# Patient Record
Sex: Female | Born: 1945 | Hispanic: No | Marital: Married | Smoking: Never smoker
Health system: Southern US, Community
[De-identification: ages and names within clinical notes are randomized; demographics above are authoritative.]

## PROBLEM LIST (undated history)

## (undated) DIAGNOSIS — F419 Anxiety disorder, unspecified: Secondary | ICD-10-CM

## (undated) DIAGNOSIS — D249 Benign neoplasm of unspecified breast: Secondary | ICD-10-CM

## (undated) DIAGNOSIS — E785 Hyperlipidemia, unspecified: Secondary | ICD-10-CM

## (undated) DIAGNOSIS — G4730 Sleep apnea, unspecified: Secondary | ICD-10-CM

## (undated) DIAGNOSIS — M1990 Unspecified osteoarthritis, unspecified site: Secondary | ICD-10-CM

## (undated) DIAGNOSIS — N939 Abnormal uterine and vaginal bleeding, unspecified: Secondary | ICD-10-CM

## (undated) DIAGNOSIS — K219 Gastro-esophageal reflux disease without esophagitis: Secondary | ICD-10-CM

## (undated) DIAGNOSIS — J309 Allergic rhinitis, unspecified: Secondary | ICD-10-CM

## (undated) DIAGNOSIS — I471 Supraventricular tachycardia, unspecified: Secondary | ICD-10-CM

## (undated) DIAGNOSIS — I10 Essential (primary) hypertension: Secondary | ICD-10-CM

## (undated) DIAGNOSIS — K648 Other hemorrhoids: Secondary | ICD-10-CM

## (undated) DIAGNOSIS — I499 Cardiac arrhythmia, unspecified: Secondary | ICD-10-CM

## (undated) DIAGNOSIS — E119 Type 2 diabetes mellitus without complications: Secondary | ICD-10-CM

## (undated) DIAGNOSIS — R42 Dizziness and giddiness: Secondary | ICD-10-CM

## (undated) HISTORY — PX: BREAST SURGERY: SHX581

---

## 1973-04-02 HISTORY — PX: DILATION AND CURETTAGE OF UTERUS: SHX78

## 1974-04-02 HISTORY — PX: BREAST EXCISIONAL BIOPSY: SUR124

## 2002-04-02 HISTORY — PX: UVULOPALATOPLASTY: SHX2633

## 2004-04-02 HISTORY — PX: COLONOSCOPY: SHX174

## 2004-04-07 ENCOUNTER — Inpatient Hospital Stay: Payer: Self-pay | Admitting: Internal Medicine

## 2004-04-18 ENCOUNTER — Ambulatory Visit: Payer: Self-pay | Admitting: Unknown Physician Specialty

## 2004-07-04 ENCOUNTER — Ambulatory Visit: Payer: Self-pay | Admitting: Gastroenterology

## 2005-05-02 ENCOUNTER — Ambulatory Visit: Payer: Self-pay | Admitting: Unknown Physician Specialty

## 2006-05-07 ENCOUNTER — Ambulatory Visit: Payer: Self-pay | Admitting: Unknown Physician Specialty

## 2007-06-05 ENCOUNTER — Ambulatory Visit: Payer: Self-pay | Admitting: Unknown Physician Specialty

## 2008-04-02 HISTORY — PX: WRIST GANGLION EXCISION: SUR520

## 2008-04-07 ENCOUNTER — Ambulatory Visit: Payer: Self-pay | Admitting: Specialist

## 2008-04-07 ENCOUNTER — Ambulatory Visit: Payer: Self-pay | Admitting: Cardiology

## 2008-04-15 ENCOUNTER — Ambulatory Visit: Payer: Self-pay | Admitting: Specialist

## 2008-06-15 ENCOUNTER — Ambulatory Visit: Payer: Self-pay | Admitting: Unknown Physician Specialty

## 2009-05-07 ENCOUNTER — Emergency Department: Payer: Self-pay | Admitting: Emergency Medicine

## 2009-06-21 ENCOUNTER — Ambulatory Visit: Payer: Self-pay | Admitting: Unknown Physician Specialty

## 2010-06-28 ENCOUNTER — Ambulatory Visit: Payer: Self-pay | Admitting: Unknown Physician Specialty

## 2011-07-03 ENCOUNTER — Ambulatory Visit: Payer: Self-pay | Admitting: Family Medicine

## 2012-04-02 HISTORY — PX: ROTATOR CUFF REPAIR: SHX139

## 2012-08-07 ENCOUNTER — Ambulatory Visit: Payer: Self-pay | Admitting: Family Medicine

## 2012-10-21 ENCOUNTER — Ambulatory Visit: Payer: Self-pay | Admitting: Orthopedic Surgery

## 2012-10-30 ENCOUNTER — Ambulatory Visit: Payer: Self-pay | Admitting: Orthopedic Surgery

## 2012-11-04 ENCOUNTER — Ambulatory Visit: Payer: Self-pay | Admitting: Orthopedic Surgery

## 2013-08-06 ENCOUNTER — Ambulatory Visit: Payer: Self-pay | Admitting: Physician Assistant

## 2013-08-11 ENCOUNTER — Ambulatory Visit: Payer: Self-pay | Admitting: Physician Assistant

## 2013-08-12 LAB — PATHOLOGY REPORT

## 2013-09-01 ENCOUNTER — Ambulatory Visit: Payer: Self-pay | Admitting: Surgery

## 2013-09-01 LAB — POTASSIUM: POTASSIUM: 3.4 mmol/L — AB (ref 3.5–5.1)

## 2013-09-11 ENCOUNTER — Ambulatory Visit: Payer: Self-pay | Admitting: Surgery

## 2013-09-14 HISTORY — PX: BREAST EXCISIONAL BIOPSY: SUR124

## 2013-09-14 LAB — PATHOLOGY REPORT

## 2014-03-15 ENCOUNTER — Ambulatory Visit: Payer: Self-pay | Admitting: Family Medicine

## 2014-04-02 ENCOUNTER — Ambulatory Visit: Payer: Self-pay | Admitting: Family Medicine

## 2014-05-03 ENCOUNTER — Ambulatory Visit: Payer: Self-pay | Admitting: Family Medicine

## 2014-07-23 NOTE — Op Note (Signed)
PATIENT NAME:  Nicole Jordan, Nicole Jordan  MR#:  [id number] DATE OF BIRTH:  06/14/1945  DATE OF PROCEDURE:  11/04/2012  PREOPERATIVE DIAGNOSES:  1. Right shoulder rotator cuff tear.  2. Subacromial impingement.  3. Biceps tenosynovitis/fraying.   POSTOPERATIVE DIAGNOSES:  1. Right shoulder rotator cuff tear.  2. Subacromial impingement.  3. Biceps tenosynovitis/fraying.   PROCEDURE PERFORMED:  1. Arthroscopic rotator cuff repair.  2. Subacromial decompression and extensive bursectomy.  3. Biceps tenotomy.   SURGEON: Dawayne Patricia, MD  ASSISTANT: Anitra Lauth, PA  ANESTHESIA: General anesthesia and interscalene block.   ESTIMATED BLOOD LOSS: Minimal.   SURGICAL FINDINGS: Nicole Jordan was found to have extensive synovitis about the shoulder. Her biceps was found to be quite frayed and injected. She was found to have a complex tear of the supraspinatus with a very narrow U shape at the anterior aspect of the tendon. Extensive scarring and thickening of the rotator interval and downward sloping lateral acromion.   COMPLICATIONS: No immediate intraoperative or postoperative complications are noted.   INDICATIONS FOR PROCEDURE: Nicole Jordan is a 69 year old female who presented as an outpatient after sustaining a fall. She noted significant weakness and pain with motion of the shoulder. MRI confirmed the presence of a rotator cuff tear. After medical clearance was obtained, the patient decided to proceed with surgical intervention. Risks and benefits were explained to the patient. All of her questions were answered. She demonstrated good understanding of the surgical procedure as well as the expected course of recovery. Her daughter was present at the preoperative visit.   DESCRIPTION OF PROCEDURE: Nicole Jordan was identified in the preoperative holding area. Right upper extremity was marked as the operative site. She was brought into the operating room and placed on the table in a supine position.  Interscalene block and general anesthesia were administered. The patient was placed in a beach chair position with her head adequately secured and her neck properly aligned. All bony prominences were padded. The patient's body was secured using kidney pads and 2 straps.    The right upper extremity was prepared and draped in the usual sterile fashion. Surgical timeout was performed, identifying the patient, procedure, laterality, confirming administration of preoperative antibiotics, confirming that imaging present was in fact the patient's, and correlating with the consent form.   Standard posterior viewing portal was made. Arthroscope was inserted, and diagnostic arthroscopy was performed. There was extensive synovitis throughout the glenohumeral joint. There was significant labral fraying. Under direct visualization, an anterior portal was made in the rotator interval. A probe was inserted. The labrum was found to be stable. Biceps tendon was evaluated. It was found to be significantly frayed and injected. The tendon was pulled into the joint, and portion present in the groove was found to be severely injected. Rotator cuff was evaluated, and supraspinatus tendon was found to have a very narrow U-shaped tear at the anterior edge.   A shaver was inserted through the anterior portal. Extensive synovectomy was carried out. The frayed portion of the labrum was shaved back to stable edge. The undersurface of the rotator cuff tear was gently debrided. At this time, an ArthroCare wand was inserted through the anterior portal, and biceps tendon was transected. The residual stub was trimmed back to a stable edge.   A shaver was reinserted, and extensive capsulectomy was done anteriorly given the significant amount of scar tissue. The rotator interval was safely debrided back until the deep fibers of the conjoined tendon were able to  be visualized.   A lateral portal was made in line with the rotator cuff tear. A  shaver was inserted, and further debridement of the tear was carried out.   At this time, the arthroscope was inserted into the subacromial space. [place] was inserted through the lateral portal into the subacromial space. Extensive bursectomy was carried out. The rotator cuff tear was readily identified. Using a combination of a burr and ArthroCare wand, the footprint was cleared of soft tissue, and the tear was slightly widened in order to accommodate hardware placement. An awl was used to achieve areas of bleeding bone in the footprint.   Acromionizer burr was inserted through the lateral portal, and a subacromial decompression was carried out.   Determination was made to use both a side-to-side stitch as well as a SpeedBridge device. A side-to-side suture was placed given that the anterior segment of residual tendon was relatively thin and I had some concern that I would not be able to pass a FiberTape through this portion. The side-to-side suture nicely brought the tendon together.    Anterior medial anchor was placed. FiberLoop tape was passed through the anterior portion of the tendon in an area of firm-feeling tissue. FiberTape was retrieved through the anterior portal. In a similar fashion, using an awl followed by anchor deployment, a posterior medial row anchor was placed. FiberTape was passed through the posterior portion of the torn tendon, and at this time, the FiberTape loops were cut just proximal to the slicing point. One limb of each was brought back out through the lateral portal. These 2 limbs were fed through a SwiveLock for lateral row placement. An awl was used to make a hole in the anterior aspect of the lateral footprint. SwiveLock was deployed, and sutures were tightened and cleated. Anchor was deployed. Sutures were cut. In a similar fashion, the remaining 2 suture limbs were brought out the lateral portal. They were loaded through a second SwiveLock. In a similar fashion, the  posterior lateral row anchor was placed. Sutures were tightened and cleated, and anchor was deployed. The sutures were cut.   Using a SpeedBridge technique, the rotator cuff was nicely reapproximated to the footprint. The shoulder was taken through range of motion under direct visualization, and cuff was found to be intact throughout and traveled nicely as a unit. Arthroscope was then inserted into the glenohumeral joint, and rotator cuff again was evaluated and found to be nicely tacked back down to the humeral footprint.   Joint was drained of fluid. All instruments were removed. Portals were closed using 2-0 Vicryl and 3-0 nylon. Sterile dressings were applied. The 0.25% Sensorcaine plain was injected into the portal sites prior to dressing application. TENS unit was applied. Polar Care was applied. The patient was placed in a SlingShot sling. She will be discharged home the same day of surgery. She will follow up in the office later this week.   ____________________________ Dawayne Patricia, MD sr:OSi D: 11/05/2012 07:46:56 ET T: 11/05/2012 08:06:20 ET JOB#: [id number]  cc: Dawayne Patricia, MD, <Dictator> Dawayne Patricia MD ELECTRONICALLY SIGNED 11/25/2012 10:22

## 2014-07-24 NOTE — Op Note (Signed)
PATIENT NAME:  Nicole Jordan, Nicole Jordan MR#:  [id number] DATE OF BIRTH:  08-Sep-1945  DATE OF PROCEDURE:  09/11/2013  PREOPERATIVE DIAGNOSIS: Left breast mass.   POSTOPERATIVE DIAGNOSIS: Left breast mass.   PROCEDURE: Excision of left breast mass.   SURGEON: Rochel Brome, M.D.   ANESTHESIA: General.   INDICATIONS: This 69 year old female has a palpable mass in the inferior aspect of the left breast. 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   DESCRIPTION OF PROCEDURE: The patient was placed on the operating table in the supine position under general anesthesia. The left breast was prepared with ChloraPrep and draped in a sterile manner. The mass was easily palpable at the inferior border of the areola at approximately 6-o'clock to 7-o'clock position of the left breast. According to palpation, it was approximately 4 cm in dimension. A curvilinear incision was made from approximately 6-o'clock to 8-o'clock position of the areola, which was at the border of the areola and carried down through subcutaneous tissues. One traversing vein was suture ligated with 4-0 chromic and divided. The dissection was carried down through a thin layer of subcutaneous tissues and encountered a firm mass, which was not well demarcated, but was excised. During the course of the excision, there was some abrupt drainage of a black, brown, red and thick fluid suggested that part of this was cystic. The mass was excised with use of electrocautery and the excised mass appeared to be approximately 3 x 3 x 3 cm and was submitted fresh for routine pathology. The wound was inspected. Several small bleeding points were cauterized. Hemostasis subsequently appeared to be intact. The deeper tissues surrounding cautery artifact were infiltrated with 0.5% Sensorcaine with epinephrine and also subcutaneous tissues were  infiltrated. Following this, the subcutaneous tissues were closed with interrupted 4-0 chromic and the skin was closed with a running 4-0 Monocryl subcuticular suture and Dermabond. The patient tolerated surgery satisfactorily and was then prepared for transfer to the recovery room.   ____________________________ Lenna Sciara. Rochel Brome, MD jws:aw D: 09/11/2013 08:36:30 ET T: 09/11/2013 08:46:34 ET JOB#: [id number]  cc: Loreli Dollar, MD, <Dictator> Loreli Dollar MD ELECTRONICALLY SIGNED 09/14/2013 8:24

## 2014-08-24 ENCOUNTER — Other Ambulatory Visit: Payer: Self-pay | Admitting: Family Medicine

## 2014-08-24 DIAGNOSIS — Z1231 Encounter for screening mammogram for malignant neoplasm of breast: Secondary | ICD-10-CM

## 2014-09-07 ENCOUNTER — Ambulatory Visit
Admission: RE | Admit: 2014-09-07 | Discharge: 2014-09-07 | Disposition: A | Discharge status: Home / Self Care | Payer: Medicare Other | Source: Ambulatory Visit | Attending: Family Medicine | Admitting: Family Medicine

## 2014-09-07 ENCOUNTER — Other Ambulatory Visit: Payer: Self-pay | Admitting: Family Medicine

## 2014-09-07 DIAGNOSIS — Z1231 Encounter for screening mammogram for malignant neoplasm of breast: Secondary | ICD-10-CM | POA: Insufficient documentation

## 2014-11-09 ENCOUNTER — Encounter: Payer: Self-pay | Admitting: *Deleted

## 2015-08-31 ENCOUNTER — Other Ambulatory Visit: Payer: Self-pay | Admitting: Family Medicine

## 2015-08-31 DIAGNOSIS — Z1231 Encounter for screening mammogram for malignant neoplasm of breast: Secondary | ICD-10-CM

## 2015-09-15 ENCOUNTER — Ambulatory Visit
Admission: RE | Admit: 2015-09-15 | Discharge: 2015-09-15 | Disposition: A | Discharge status: Home / Self Care | Payer: Medicare Other | Source: Ambulatory Visit | Attending: Family Medicine | Admitting: Family Medicine

## 2015-09-15 ENCOUNTER — Other Ambulatory Visit: Payer: Self-pay | Admitting: Family Medicine

## 2015-09-15 DIAGNOSIS — Z1231 Encounter for screening mammogram for malignant neoplasm of breast: Secondary | ICD-10-CM | POA: Diagnosis not present

## 2016-02-10 IMAGING — US US BREAST*L* LIMITED INC AXILLA
1 series · 11 of 11 positions shown · non-contrast
Comparison: All prior studies dating back to 4009

CLINICAL DATA: History of benign biopsy left upper outer quadrant
in 9530, patient palpating a mass lower inner quadrant today

EXAM:
DIGITAL DIAGNOSTIC  BILATERAL MAMMOGRAM WITH CAD
ULTRASOUND LEFT BREAST

[Series 1: us breast*left* limited inc axilla · 0.08mm/px · 11 of 11 slices shown]
[im 1/11]
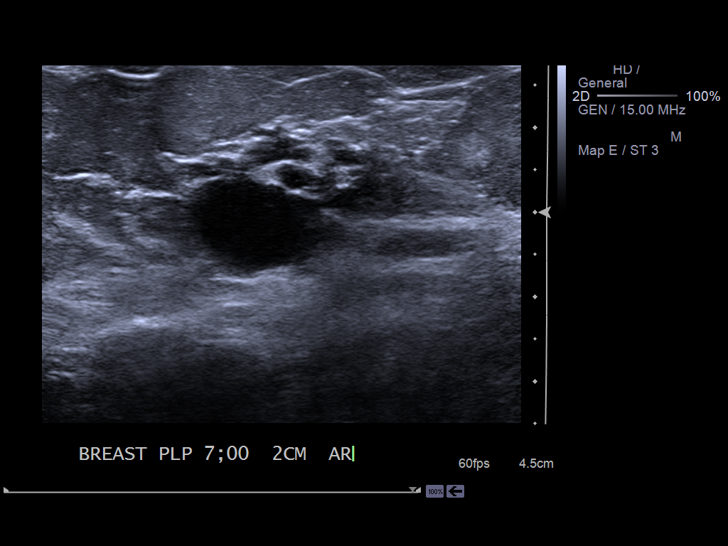
[im 2/11]
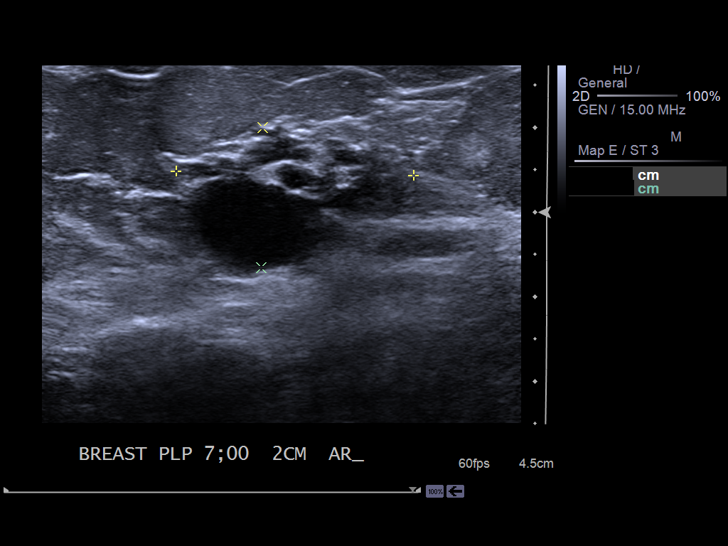
[im 3/11]
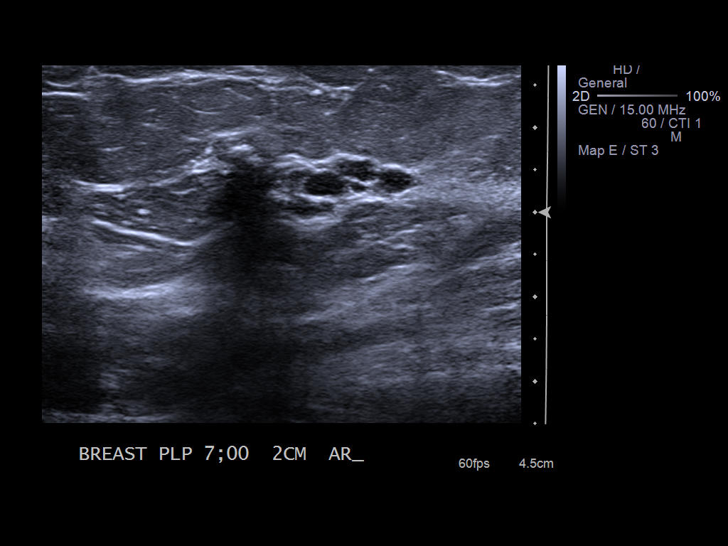
[im 4/11]
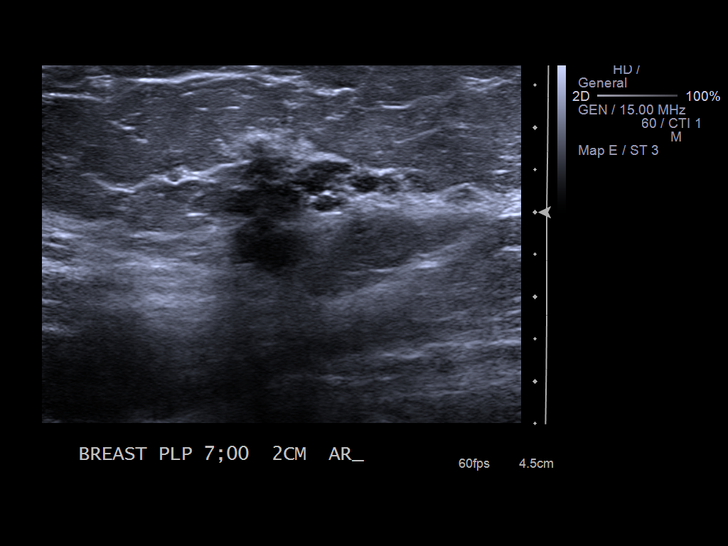
[im 5/11]
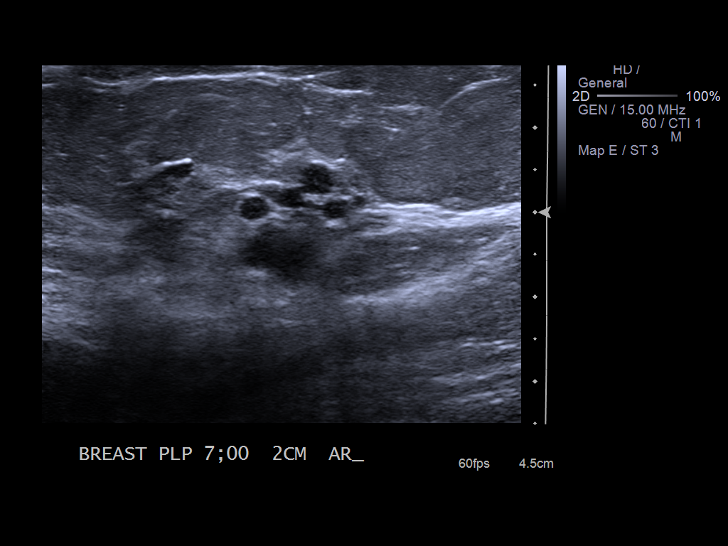
[im 6/11]
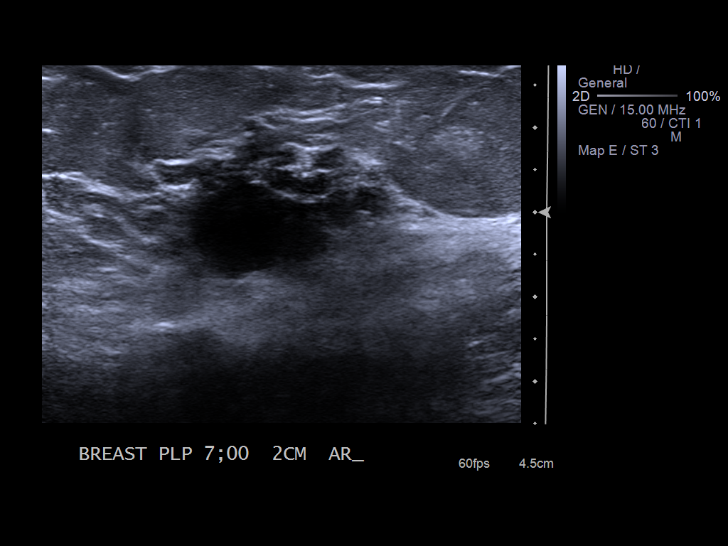
[im 7/11]
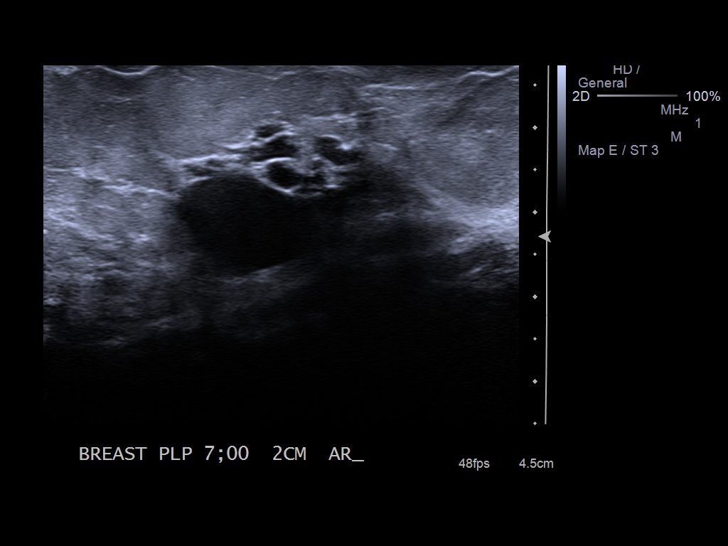
[im 8/11]
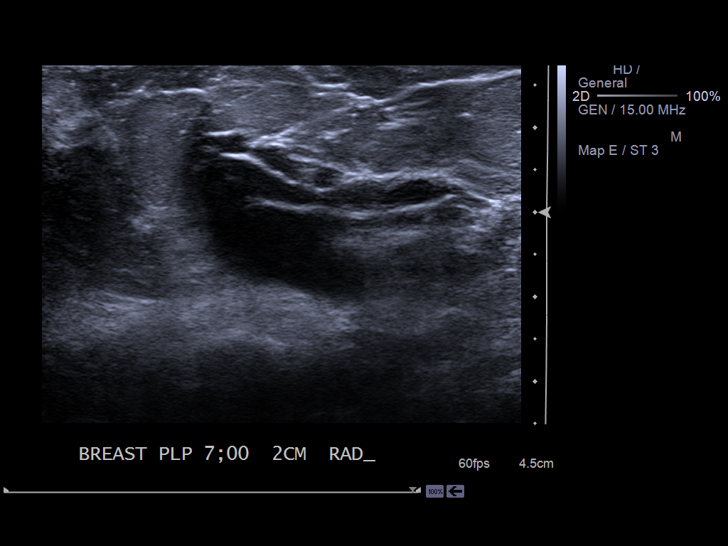
[im 9/11]
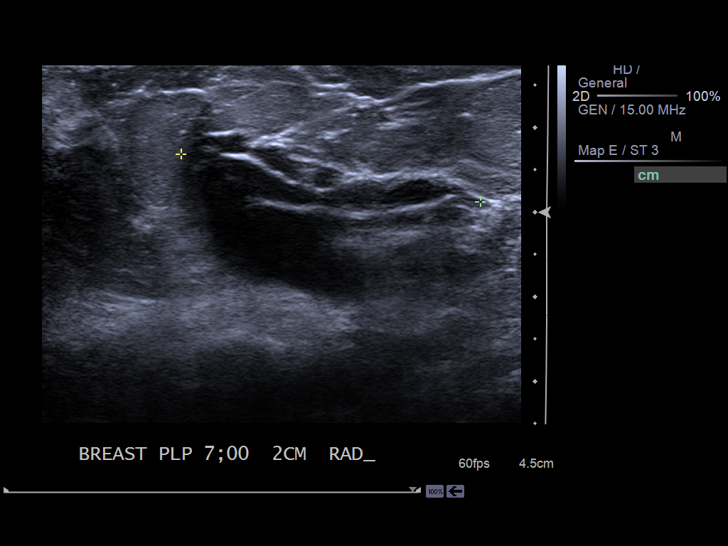
[im 10/11]
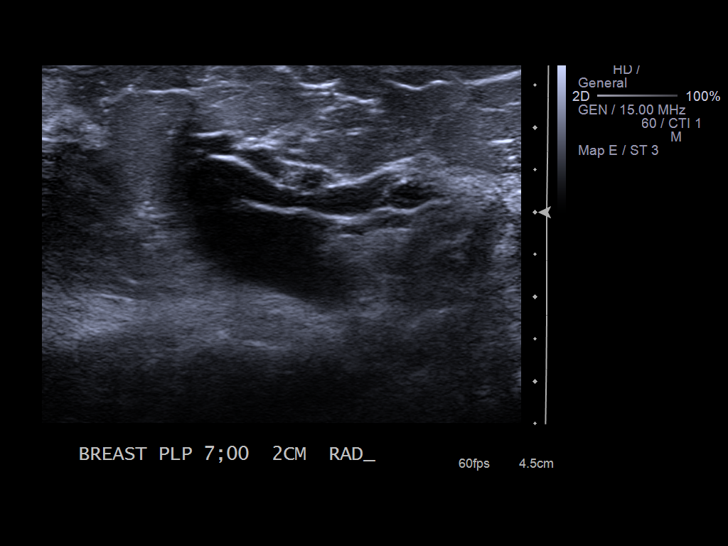
[im 11/11]
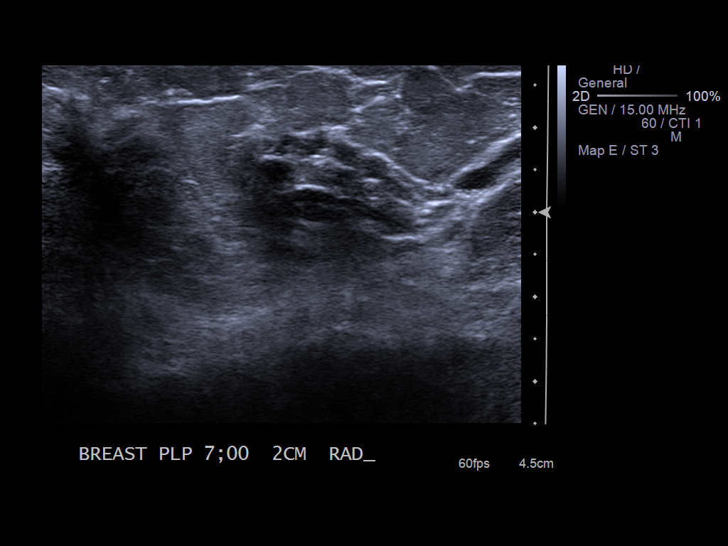

[11 of 11 positions shown; findings below may reference images not displayed]

ACR Breast Density Category c: The breast tissue is heterogeneously
dense, which may obscure small masses.
FINDINGS: Right breast is unchanged and negative. On the left in the 7 o'clock
position at the middle third depth, there is a 5 x 5 x 2 cm mass. It
is an irregular mass with indistinct borders and increased
attenuation.

Mammographic images were processed with CAD.

On physical exam, there is a probable 3 cm mass 2 cm from the nipple
in the 7 o'clock position of the left breast.

Ultrasound is performed, showing that the palpable mass measures by
ultrasound 3.6 x 2.8 x 1.7 cm. It is comprised of numerous fluid
filled tubular and cystic spaces with intervening soft tissue. The
borders of the mass are not well defined. The mass shows mixed
posterior acoustic attenuation and enhancement. Ultrasound of the
left axilla is negative.
IMPRESSION: Palpable mass left breast composed of numerous cystic and tubular
fluid-filled spaces with intervening soft tissue.

RECOMMENDATION:
Ultrasound-guided core needle biopsy is recommended and will be
scheduled at the patient's convenience.

I have discussed the findings and recommendations with the patient.
Results were also provided in writing at the conclusion of the
visit. If applicable, a reminder letter will be sent to the patient
regarding the next appointment.

BI-RADS CATEGORY  4: Suspicious.

## 2016-02-15 IMAGING — MG MM POST US BIOPSY *L*
1 series · 2 of 2 positions shown · non-contrast
Comparison: Previous exams

CLINICAL DATA: Status post ultrasound-guided core biopsy of mass in
the 7 o'clock location of the left breast.

EXAM:
DIAGNOSTIC LEFT MAMMOGRAM POST ULTRASOUND BIOPSY

[L CC · left · 2 of 2 slices shown]
[im 1/2]
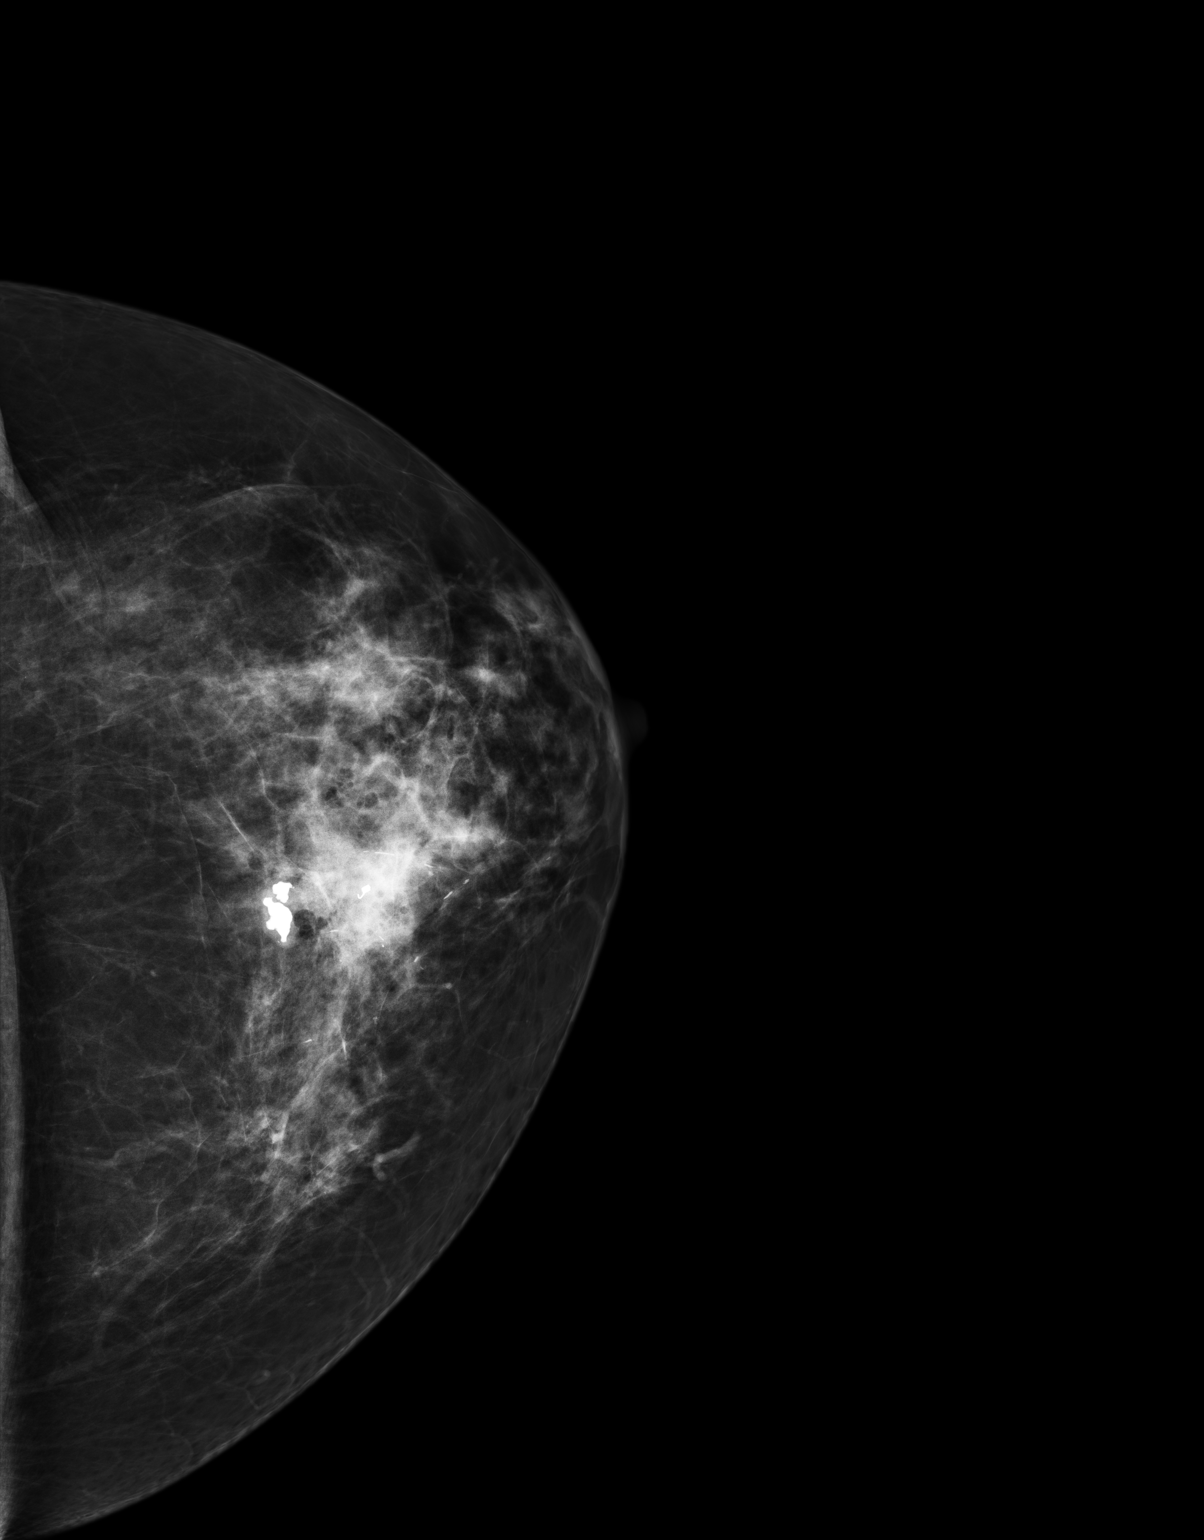
[im 2/2]
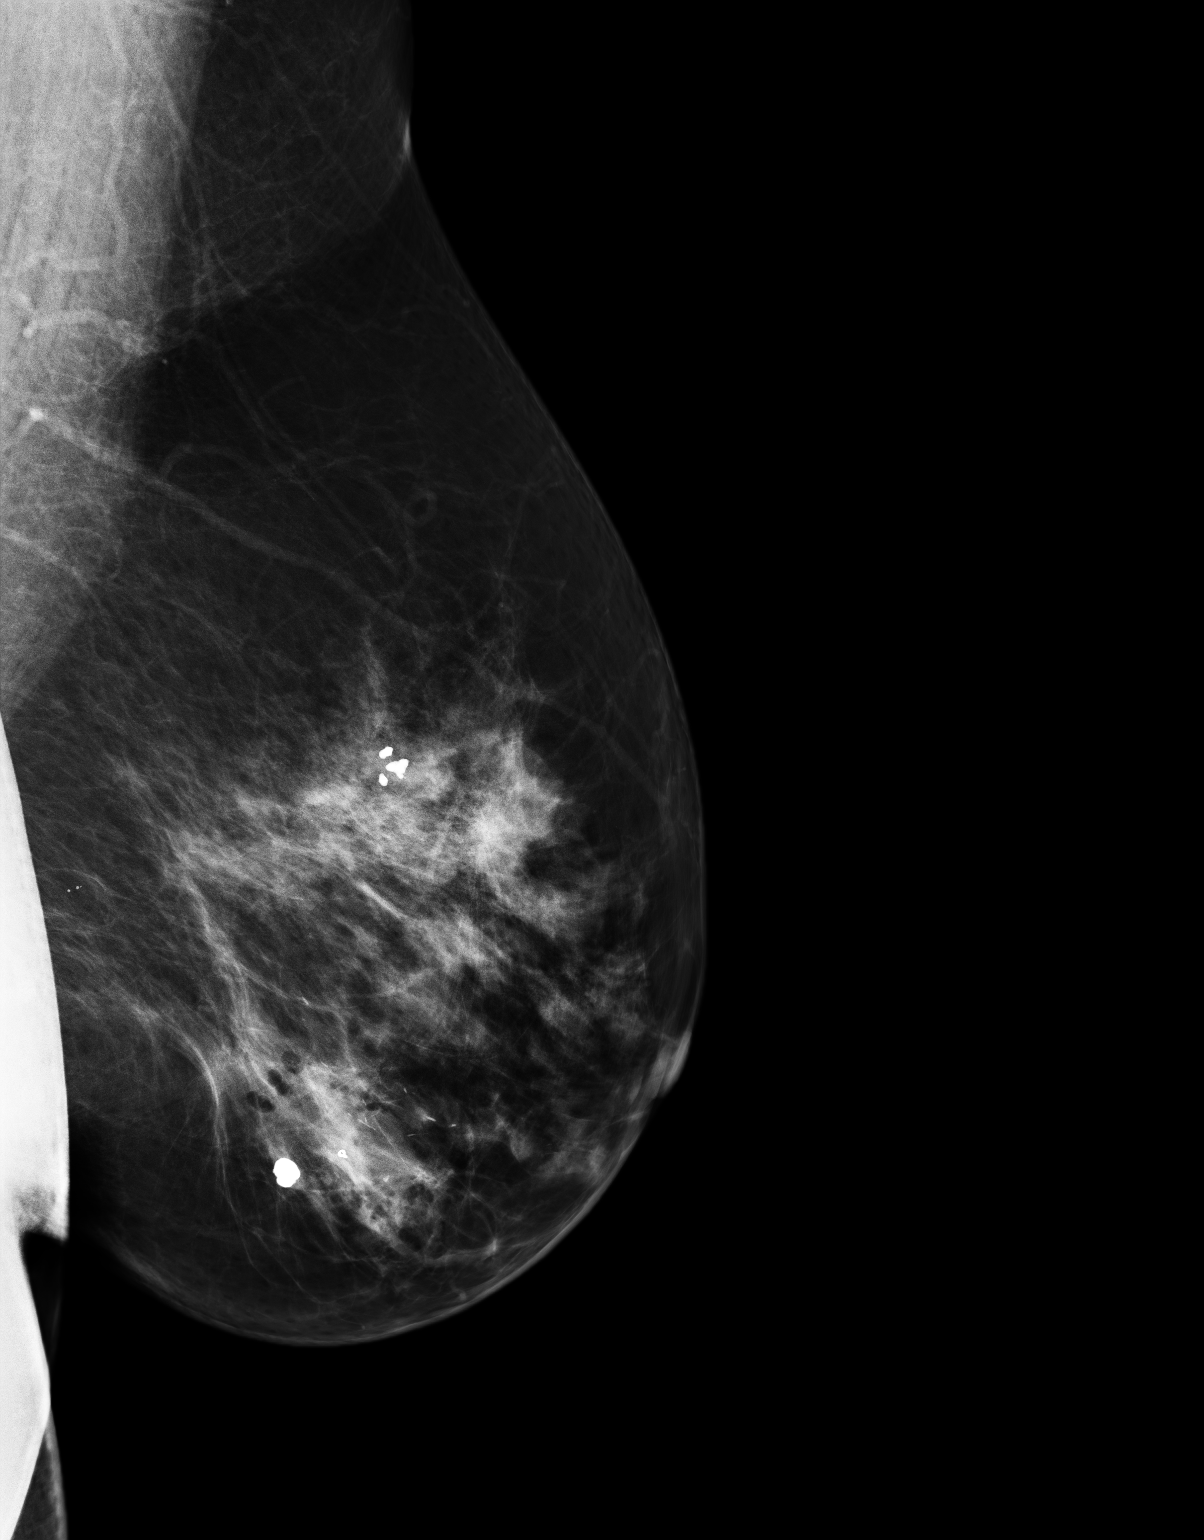

[2 of 2 positions shown; findings below may reference images not displayed]

FINDINGS: Mammographic images were obtained following ultrasound guided biopsy
of mass in the 7 o'clock location of the left breast. A coil shaped
clip is identified within the mass in the lower inner quadrant of
the left breast, as expected after biopsy..
IMPRESSION: Tissue marker clip is in expected location.

Final Assessment: Post Procedure Mammograms for Marker Placement

## 2016-02-15 IMAGING — US US BIOPSY BREAST CORE W/ IMAGING
1 series · 13 of 16 positions shown · non-contrast
Comparison: 08/07/2012, 08/06/2013, and earlier

ADDENDUM:
Pathologic result indicated cystic papillary lesion for which
excisional biopsy is recommended. This is concordant with the
imaging findings. I spoke with the patient at [DATE] on 08/14/2013,
and discussed these results and recommendations. She reported no
complications after the biopsy. Her referring physician will be
contacted and will establish surgical referral and she stated that
she would contact the office by early next week to confirm this.
CLINICAL DATA: Patient returns for ultrasound-guided core biopsy of
mass in the 7 o'clock location of the left breast.

EXAM:
ULTRASOUND GUIDED LEFT BREAST CORE NEEDLE BIOPSY

[Series 1: us biopsy breast core w/ imaging · 0.08mm/px · 13 of 16 slices shown]
[im 1/16]
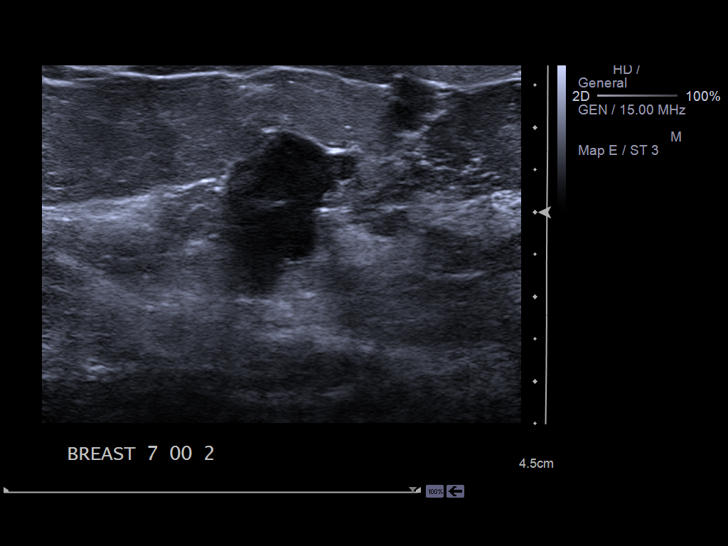
[im 2/16]
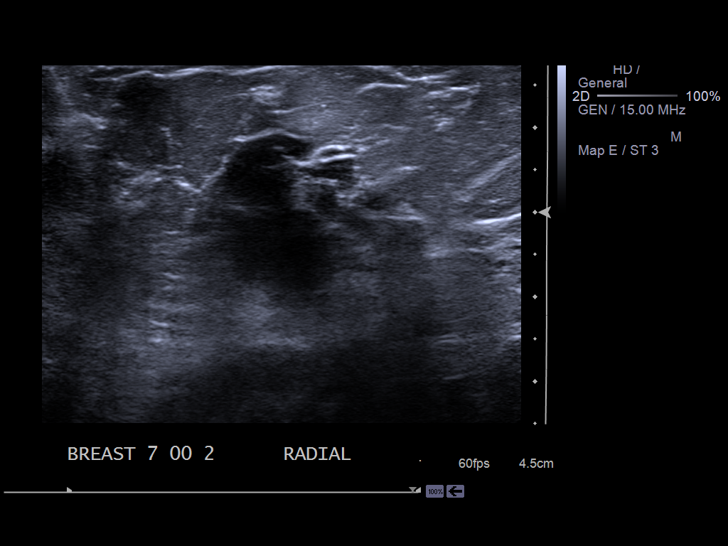
[im 4/16]
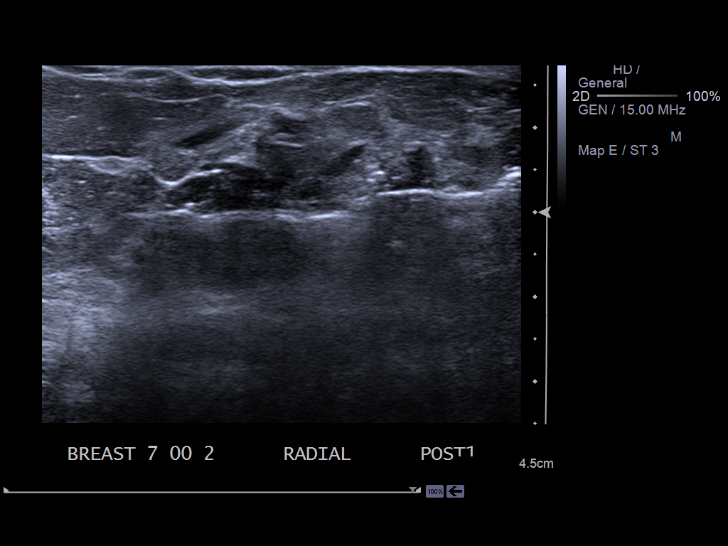
[im 5/16]
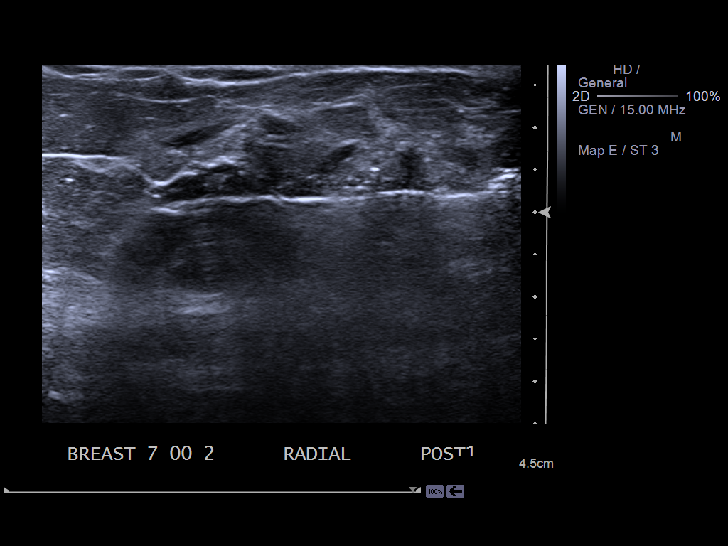
[im 6/16]
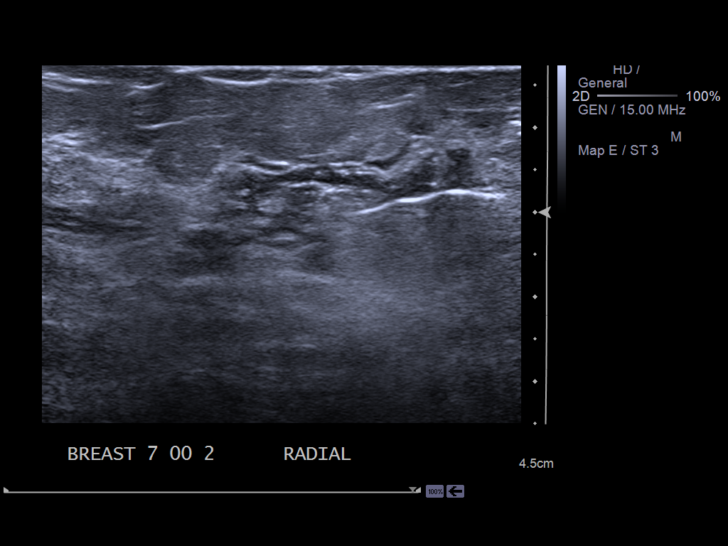
[im 7/16]
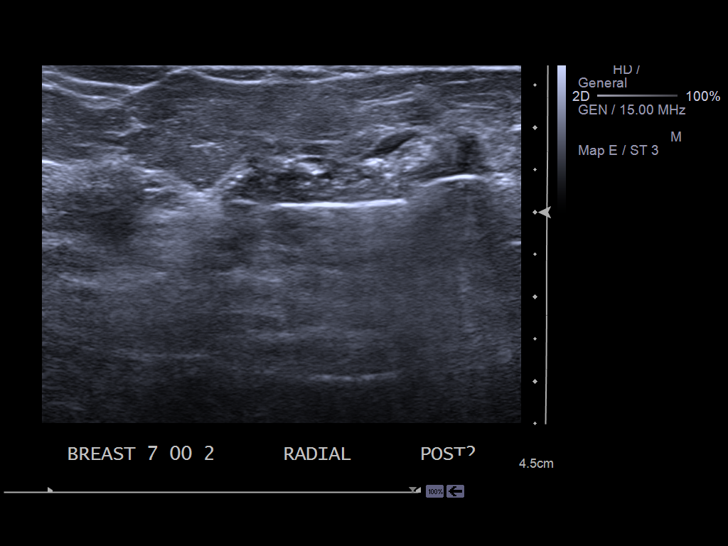
[im 9/16]
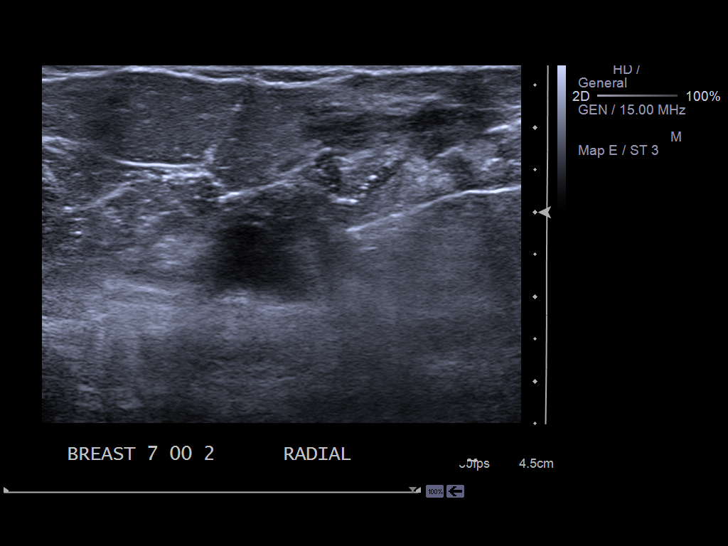
[im 10/16]
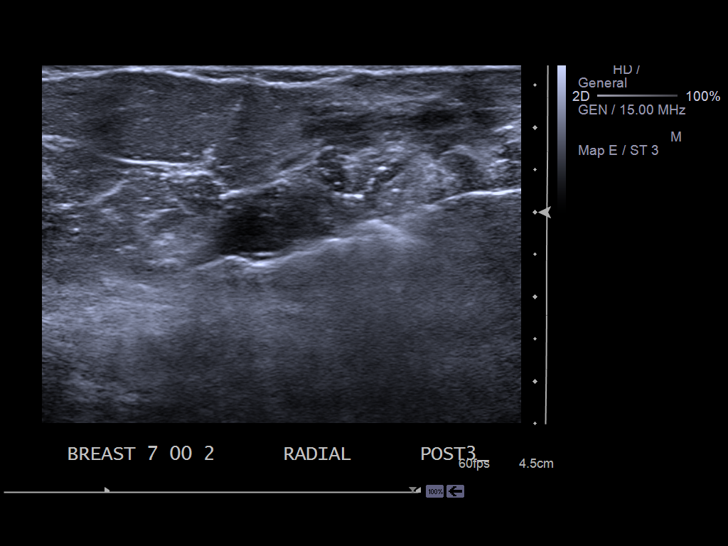
[im 11/16]
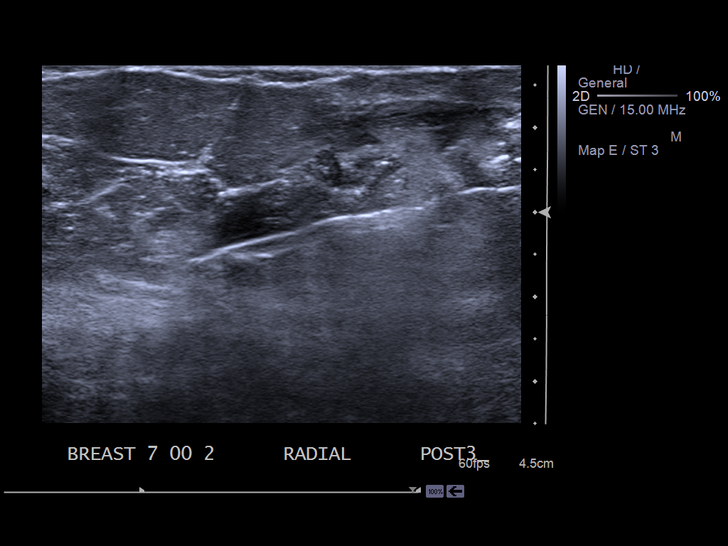
[im 12/16]
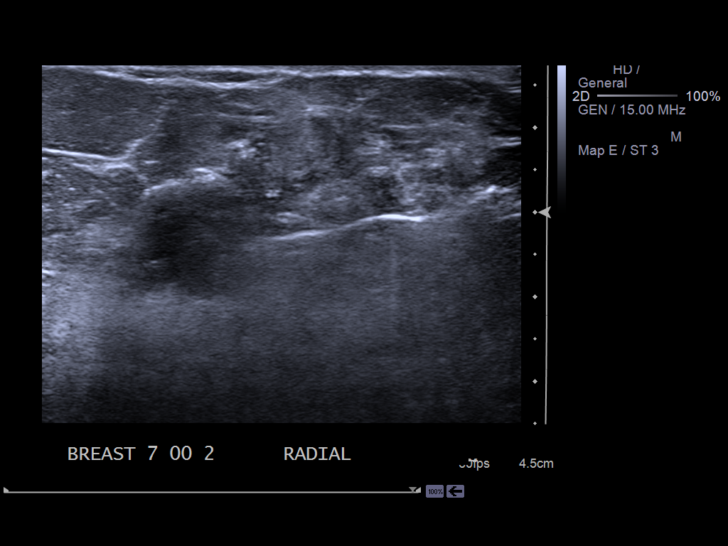
[im 13/16]
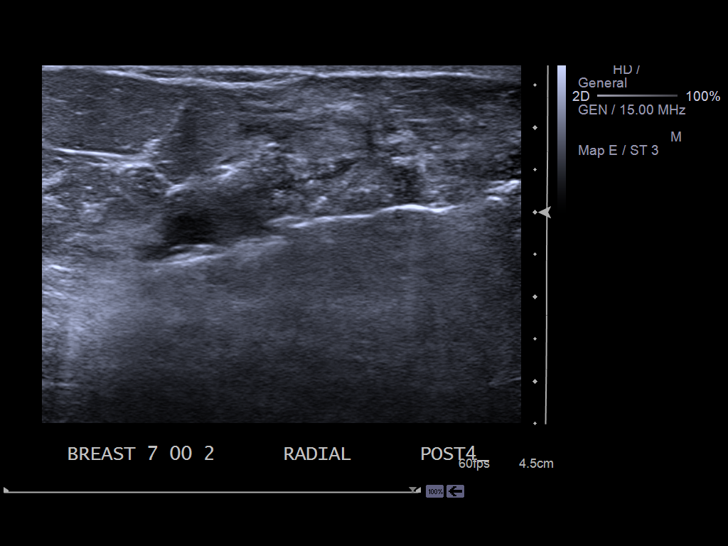
[im 15/16]
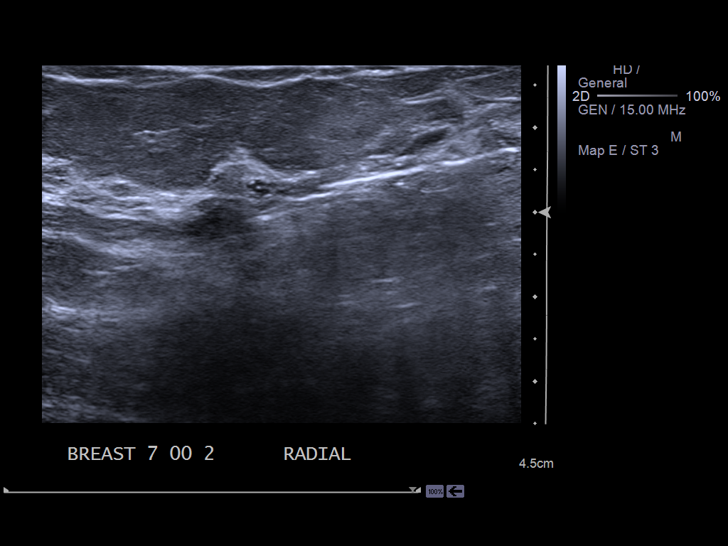
[im 16/16]
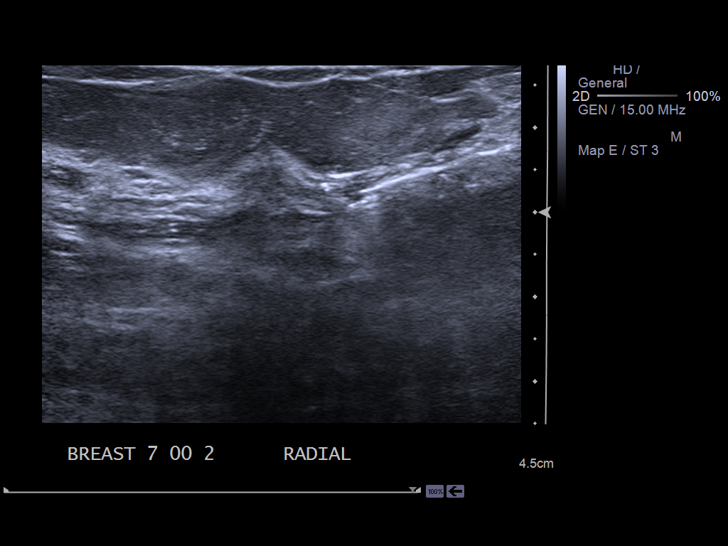

[13 of 16 positions shown; findings below may reference images not displayed]

PROCEDURE:
I met with the patient and we discussed the procedure of
ultrasound-guided biopsy, including benefits and alternatives. We
discussed the high likelihood of a successful procedure. We
discussed the risks of the procedure including infection, bleeding,
tissue injury, clip migration, and inadequate sampling. Informed
written consent was given. The usual time-out protocol was performed
immediately prior to the procedure.

Using sterile technique and 2% Lidocaine as local anesthetic, under
direct ultrasound visualization, a 12 gauge vacuum-assisteddevice
was used to perform biopsy of palpable mass in the 7 o'clock
location of the left breast using a in for lateral approach. At the
conclusion of the procedure, a coil shaped tissue marker clip was
deployed into the biopsy cavity. Follow-up 2-view mammogram was
performed and dictated separately.
IMPRESSION: Ultrasound-guided biopsy of left breast mass. No apparent
complications.

## 2016-09-10 ENCOUNTER — Other Ambulatory Visit: Payer: Self-pay | Admitting: Family Medicine

## 2016-09-10 DIAGNOSIS — Z1231 Encounter for screening mammogram for malignant neoplasm of breast: Secondary | ICD-10-CM

## 2016-09-18 ENCOUNTER — Ambulatory Visit
Admission: RE | Admit: 2016-09-18 | Discharge: 2016-09-18 | Disposition: A | Discharge status: Home / Self Care | Payer: Medicare Other | Source: Ambulatory Visit | Attending: Family Medicine | Admitting: Family Medicine

## 2016-09-18 DIAGNOSIS — Z1231 Encounter for screening mammogram for malignant neoplasm of breast: Secondary | ICD-10-CM | POA: Insufficient documentation

## 2016-11-28 DIAGNOSIS — E119 Type 2 diabetes mellitus without complications: Secondary | ICD-10-CM | POA: Insufficient documentation

## 2017-08-19 ENCOUNTER — Other Ambulatory Visit: Payer: Self-pay | Admitting: Family Medicine

## 2017-08-19 DIAGNOSIS — Z1231 Encounter for screening mammogram for malignant neoplasm of breast: Secondary | ICD-10-CM

## 2017-09-19 ENCOUNTER — Ambulatory Visit: Payer: Medicare Other

## 2017-09-25 ENCOUNTER — Ambulatory Visit
Admission: RE | Admit: 2017-09-25 | Discharge: 2017-09-25 | Disposition: A | Discharge status: Home / Self Care | Payer: Medicare Other | Source: Ambulatory Visit | Attending: Family Medicine | Admitting: Family Medicine

## 2017-09-25 ENCOUNTER — Encounter (INDEPENDENT_AMBULATORY_CARE_PROVIDER_SITE_OTHER): Payer: Self-pay

## 2017-09-25 DIAGNOSIS — Z1231 Encounter for screening mammogram for malignant neoplasm of breast: Secondary | ICD-10-CM | POA: Diagnosis present

## 2018-09-10 ENCOUNTER — Other Ambulatory Visit: Payer: Self-pay | Admitting: Family Medicine

## 2018-09-10 DIAGNOSIS — Z1231 Encounter for screening mammogram for malignant neoplasm of breast: Secondary | ICD-10-CM

## 2018-10-15 ENCOUNTER — Encounter (INDEPENDENT_AMBULATORY_CARE_PROVIDER_SITE_OTHER): Payer: Self-pay

## 2018-10-15 ENCOUNTER — Other Ambulatory Visit: Payer: Self-pay

## 2018-10-15 ENCOUNTER — Ambulatory Visit
Admission: RE | Admit: 2018-10-15 | Discharge: 2018-10-15 | Disposition: A | Discharge status: Home / Self Care | Payer: Medicare Other | Source: Ambulatory Visit | Attending: Family Medicine | Admitting: Family Medicine

## 2018-10-15 DIAGNOSIS — Z1231 Encounter for screening mammogram for malignant neoplasm of breast: Secondary | ICD-10-CM | POA: Insufficient documentation

## 2019-09-15 ENCOUNTER — Other Ambulatory Visit: Payer: Self-pay | Admitting: Family Medicine

## 2019-09-15 DIAGNOSIS — Z1231 Encounter for screening mammogram for malignant neoplasm of breast: Secondary | ICD-10-CM

## 2019-10-19 ENCOUNTER — Ambulatory Visit: Payer: Medicare Other

## 2019-10-19 ENCOUNTER — Ambulatory Visit
Admission: RE | Admit: 2019-10-19 | Discharge: 2019-10-19 | Disposition: A | Discharge status: Home / Self Care | Payer: Medicare PPO | Source: Ambulatory Visit | Attending: Family Medicine | Admitting: Family Medicine

## 2019-10-19 ENCOUNTER — Other Ambulatory Visit: Payer: Self-pay

## 2019-10-19 DIAGNOSIS — Z1231 Encounter for screening mammogram for malignant neoplasm of breast: Secondary | ICD-10-CM | POA: Insufficient documentation

## 2020-01-11 ENCOUNTER — Ambulatory Visit: Payer: Medicare PPO | Attending: Internal Medicine

## 2020-01-11 DIAGNOSIS — Z23 Encounter for immunization: Secondary | ICD-10-CM

## 2020-01-11 NOTE — Progress Notes (Signed)
   Covid-19 Vaccination Clinic  Name:  Nicole Jordan    MRN: [id number] DOB: April 11, 1945  01/11/2020  [name] was observed post Covid-19 immunization for 15 minutes without incident. She was provided with Vaccine Information Sheet and instruction to access the V-Safe system.   [name] was instructed to call [phone] with any severe reactions post vaccine: Marland Kitchen Difficulty breathing  . Swelling of face and throat  . A fast heartbeat  . A bad rash all over body  . Dizziness and weakness

## 2020-09-27 ENCOUNTER — Other Ambulatory Visit: Payer: Self-pay | Admitting: Family Medicine

## 2020-09-27 DIAGNOSIS — Z1231 Encounter for screening mammogram for malignant neoplasm of breast: Secondary | ICD-10-CM

## 2020-10-19 ENCOUNTER — Ambulatory Visit
Admission: RE | Admit: 2020-10-19 | Discharge: 2020-10-19 | Disposition: A | Discharge status: Home / Self Care | Payer: Medicare PPO | Source: Ambulatory Visit | Attending: Family Medicine | Admitting: Family Medicine

## 2020-10-19 ENCOUNTER — Other Ambulatory Visit: Payer: Self-pay

## 2020-10-19 DIAGNOSIS — Z1231 Encounter for screening mammogram for malignant neoplasm of breast: Secondary | ICD-10-CM | POA: Insufficient documentation

## 2021-01-20 ENCOUNTER — Other Ambulatory Visit: Payer: Self-pay

## 2021-01-20 ENCOUNTER — Emergency Department
Admission: EM | Admit: 2021-01-20 | Discharge: 2021-01-20 | Disposition: A | Discharge status: Home / Self Care | Payer: Medicare PPO | Attending: Emergency Medicine | Admitting: Emergency Medicine

## 2021-01-20 ENCOUNTER — Emergency Department
Admission: EM | Admit: 2021-01-20 | Discharge: 2021-01-21 | Disposition: A | Discharge status: Home / Self Care | Payer: Medicare PPO | Source: Home / Self Care | Attending: Emergency Medicine | Admitting: Emergency Medicine

## 2021-01-20 DIAGNOSIS — R040 Epistaxis: Secondary | ICD-10-CM

## 2021-01-20 MED ORDER — CEPHALEXIN 500 MG PO CAPS
500.0000 mg | ORAL_CAPSULE | Freq: Two times a day (BID) | ORAL | 0 refills | Status: DC
Start: 2021-01-20 — End: 2021-01-20

## 2021-01-20 MED ORDER — CEPHALEXIN 500 MG PO CAPS
500.0000 mg | ORAL_CAPSULE | Freq: Two times a day (BID) | ORAL | 0 refills | Status: DC
Start: 2021-01-20 — End: 2021-12-15

## 2021-01-20 MED ORDER — LIDOCAINE VISCOUS HCL 2 % MT SOLN
15.0000 mL | Freq: Once | OROMUCOSAL | Status: AC
  Administered 2021-01-20: 15 mL via OROMUCOSAL
  Filled 2021-01-20: qty 15

## 2021-01-20 MED ORDER — OXYMETAZOLINE HCL 0.05 % NA SOLN
1.0000 | Freq: Once | NASAL | Status: AC
  Administered 2021-01-20: 1 via NASAL
  Filled 2021-01-20: qty 30

## 2021-01-20 NOTE — ED Notes (Signed)
MD at the bedside  

## 2021-01-20 NOTE — ED Triage Notes (Signed)
Pt returned to the ED after being seen and treated for a nose bleed this morning,, pt has nasal tampon in place with small amount dripping from the right nare around the tampon, pt also c/o having bleeding from the right eye when she wipes it. Pt is in NAD, bleeding is controlled, denies having any bleeding back into the throat

## 2021-01-20 NOTE — ED Triage Notes (Signed)
Pt BIBA from home for nose bleed starting 1 h PTA. Pt on daily ASA, denies blood thinners. Hx of Htn.   Pt given affrin with EMS with no effect.  Bleeding controlled with pt holding pressure to nose.  Blood noted to be oozing from R lacrimal duct.

## 2021-01-20 NOTE — ED Provider Notes (Signed)
[facility] Emergency Department Provider Note   ____________________________________________    I have reviewed the triage vital signs and the nursing notes.   HISTORY  Chief Complaint Epistaxis     HPI Nicole Jordan is a 75 y.o. female who presents with complaints of nosebleed.  Patient reports right naris started bleeding initially when she held pressure she developed bleeding in her left naris as well.  She is on a daily aspirin but no other blood thinners.  No trauma or injury.  No headaches nausea or vomiting.  No fevers or chills.  No new medications reported.  No past medical history on file.  There are no problems to display for this patient.   Past Surgical History:  Procedure Laterality Date   BREAST EXCISIONAL BIOPSY Left 1976    Negative results   BREAST EXCISIONAL BIOPSY Left 09/14/2013   intraductal papilloma    Prior to Admission medications   Medication Sig Start Date End Date Taking? Authorizing Provider  cephALEXin (KEFLEX) 500 MG capsule Take 1 capsule (500 mg total) by mouth 2 (two) times daily. 01/20/21   Lavonia Drafts, MD     Allergies Patient has no known allergies.  Family History  Problem Relation Age of Onset   Breast cancer Daughter 77   Breast cancer Sister 77    Social History No smoking, no drinking  Review of Systems  Constitutional: No fever/chills Eyes: No visual changes.  ENT: As above Cardiovascular: Denies chest pain. Respiratory: Denies shortness of breath. Gastrointestinal: No abdominal pain.  No nausea, no vomiting.   Genitourinary: Negative for dysuria. Musculoskeletal: Negative for back pain. Skin: Negative for rash. Neurological: Negative for headaches or weakness   ____________________________________________   PHYSICAL EXAM:  VITAL SIGNS: ED Triage Vitals [01/20/21 0655]  Enc Vitals Group     BP (!) 144/88     Pulse Rate 69     Resp 16     Temp 97.7 F (36.5 C)      Temp Source Oral     SpO2 91 %     Weight 127.6 kg (281 lb 4.8 oz)     Height 1.778 m (5\' 10" )     Head Circumference      Peak Flow      Pain Score      Pain Loc      Pain Edu?      Excl. in [place]?     Constitutional: Alert and oriented. No acute distress. Eyes: Conjunctivae are normal.  Head: Atraumatic. Nose: Nasal clamp in place, when removed bleeding primarily from the right naris, appears anterior Mouth/Throat: Mucous membranes are moist.  Pharynx with some blood noted Neck:  Painless ROM Cardiovascular: Normal rate, regular rhythm.  Good peripheral circulation. Respiratory: Normal respiratory effort.  No retractions.  Gastrointestinal: Soft and nontender. No distention.   Genitourinary: deferred Musculoskeletal: No lower extremity tenderness nor edema.  Warm and well perfused Neurologic:  Normal speech and language. No gross focal neurologic deficits are appreciated.  Skin:  Skin is warm, dry and intact. No rash noted. Psychiatric: Mood and affect are normal. Speech and behavior are normal.  ____________________________________________   LABS (all labs ordered are listed, but only abnormal results are displayed)  Labs Reviewed - No data to display ____________________________________________  EKG  None ____________________________________________  RADIOLOGY  None ____________________________________________   PROCEDURES  Procedure(s) performed: yes  .Epistaxis Management  Date/Time: 01/20/2021 8:00 AM Performed by: Lavonia Drafts, MD Authorized by: Lavonia Drafts,  MD   Consent:    Consent obtained:  Verbal   Consent given by:  Patient Procedure details:    Treatment site:  R anterior   Treatment method:  Nasal tampon   Treatment episode: recurring   Post-procedure details:    Assessment:  Bleeding decreased   Procedure completion:  Tolerated   Critical Care performed: No ____________________________________________   INITIAL IMPRESSION  / ASSESSMENT AND PLAN / ED COURSE  Pertinent labs & imaging results that were available during my care of the patient were reviewed by me and considered in my medical decision making (see chart for details).   Patient presents with epistaxis as noted above.  No trauma, appears anterior.  Afrin nasal clamp applied with little improvement thus far.  We will proceed to [facility] as needed  Patient with continued bleeding, despite clamping, no clear area to cauterize although does appear to be anterior bleeding  Proceeded with Rhino Rocket with resolution of bleeding  ----------------------------------------- 8:28 AM on 01/20/2021 ----------------------------------------- No further bleeding, appropriate for discharge with outpatient follow-up with ENT in 5 days, Keflex provided    ____________________________________________   FINAL CLINICAL IMPRESSION(S) / ED DIAGNOSES  Final diagnoses:  Right-sided epistaxis        Note:  This document was prepared using Dragon voice recognition software and may include unintentional dictation errors.    Lavonia Drafts, MD 01/20/21 [phone]

## 2021-01-20 NOTE — ED Provider Notes (Signed)
[facility] Emergency Department Provider Note  ____________________________________________  Time seen: Approximately 11:53 PM  I have reviewed the triage vital signs and the nursing notes.   HISTORY  Chief Complaint Epistaxis    HPI Nicole Jordan is a 75 y.o. female with no significant past medical history who comes ED complaining of nosebleed.  She was seen in the ED earlier today, had a nasal packing device placed on the right nare but states that she is having bleeding around.  No fever, no trauma.  No headache.  No breathing difficulty.  Takes baby aspirin, no blood thinners.  No past medical history on file.   There are no problems to display for this patient.    Past Surgical History:  Procedure Laterality Date   BREAST EXCISIONAL BIOPSY Left 1976    Negative results   BREAST EXCISIONAL BIOPSY Left 09/14/2013   intraductal papilloma     Prior to Admission medications   Medication Sig Start Date End Date Taking? Authorizing Provider  cephALEXin (KEFLEX) 500 MG capsule Take 1 capsule (500 mg total) by mouth 2 (two) times daily. 01/20/21   Lavonia Drafts, MD     Allergies Patient has no known allergies.   Family History  Problem Relation Age of Onset   Breast cancer Daughter 29   Breast cancer Sister 84    Social History Social History   Tobacco Use   Smoking status: Never   Smokeless tobacco: Never  Substance Use Topics   Alcohol use: Never   Drug use: Never    Review of Systems  Constitutional:   No fever or chills.  ENT:   Positive nosebleed Cardiovascular:   No chest pain or syncope. Respiratory:   No dyspnea or cough. Gastrointestinal:   Negative for abdominal pain, vomiting and diarrhea.  Musculoskeletal:   Negative for focal pain or swelling All other systems reviewed and are negative except as documented above in ROS and HPI.  ____________________________________________   PHYSICAL EXAM:  VITAL  SIGNS: ED Triage Vitals  Enc Vitals Group     BP 01/20/21 1442 (!) 152/91     Pulse Rate 01/20/21 1442 68     Resp 01/20/21 1442 18     Temp 01/20/21 1442 97.9 F (36.6 C)     Temp Source 01/20/21 1442 Oral     SpO2 01/20/21 1442 97 %     Weight --      Height --      Head Circumference --      Peak Flow --      Pain Score 01/20/21 1434 0     Pain Loc --      Pain Edu? --      Excl. in [place]? --     Vital signs reviewed, nursing assessments reviewed.   Constitutional:   Alert and oriented. Non-toxic appearance. Eyes:   Conjunctivae are normal. EOMI. ENT      Head:   Normocephalic and atraumatic. Nose: Rhino Rocket in the right nare.  Indicator balloon is decompressed.  Left nare is normal without any blood.      Mouth/Throat:   MMM.  No blood in the oropharynx.      Neck:   No meningismus. Full ROM. Hematological/Lymphatic/Immunilogical:   No cervical lymphadenopathy. Cardiovascular:   RRR. Symmetric bilateral radial and DP pulses.  No murmurs. Cap refill less than 2 seconds. Respiratory:   Normal respiratory effort without tachypnea/retractions. Breath sounds are clear and equal bilaterally. No wheezes/rales/rhonchi. Musculoskeletal:  Normal range of motion in all extremities.   Neurologic:   Normal speech and language.  Motor grossly intact. No acute focal neurologic deficits are appreciated.  ____________________________________________    LABS (pertinent positives/negatives) (all labs ordered are listed, but only abnormal results are displayed) Labs Reviewed - No data to display ____________________________________________   EKG  ____________________________________________    RADIOLOGY  No results found.  ____________________________________________   PROCEDURES Procedures  ____________________________________________  CLINICAL IMPRESSION / ASSESSMENT AND PLAN / ED COURSE  Pertinent labs & imaging results that were available during my care of the  patient were reviewed by me and considered in my medical decision making (see chart for details).  Nicole Jordan was evaluated in Emergency Department on 01/20/2021 for the symptoms described in the history of present illness. She was evaluated in the context of the global COVID-19 pandemic, which necessitated consideration that the patient might be at risk for infection with the SARS-CoV-2 virus that causes COVID-19. Institutional protocols and algorithms that pertain to the evaluation of patients at risk for COVID-19 are in a state of rapid change based on information released by regulatory bodies including the CDC and federal and state organizations. These policies and algorithms were followed during the patient's care in the ED.     Clinical Course as of 01/20/21 2353  Fri Jan 20, 2021  2239 Patient presents with bleeding around her Rhino Rocket in the right nare.  Left nare is clear, oropharynx is clear, consistent with anterior bleed.  Pilot balloon is decompressed, there is not sufficient pressure in the [employer].  I added 3 mL of air which generated good pressure, had to stop there due to patient inability to tolerate the discomfort.  Will monitor for resolution of her bleeding. she picked up her Keflex prescription earlier this morning already. [PS]    Clinical Course User Index [PS] Carrie Mew, MD     ----------------------------------------- 11:55 PM on 01/20/2021 ----------------------------------------- Bleeding is completely stopped and has remained hemostatic.  She will hold her baby aspirin for now, continue her Keflex antibiotics, follow-up with ENT this week.  Stable for discharge.  ____________________________________________   FINAL CLINICAL IMPRESSION(S) / ED DIAGNOSES    Final diagnoses:  Anterior epistaxis     ED Discharge Orders     None       Portions of this note were generated with dragon dictation software. Dictation errors may occur  despite best attempts at proofreading.   Carrie Mew, MD 01/20/21 2355

## 2021-04-02 HISTORY — PX: EYE SURGERY: SHX253

## 2021-08-20 DIAGNOSIS — M1712 Unilateral primary osteoarthritis, left knee: Secondary | ICD-10-CM | POA: Insufficient documentation

## 2021-09-27 ENCOUNTER — Other Ambulatory Visit: Payer: Self-pay | Admitting: Family Medicine

## 2021-09-27 DIAGNOSIS — Z1231 Encounter for screening mammogram for malignant neoplasm of breast: Secondary | ICD-10-CM

## 2021-10-23 ENCOUNTER — Ambulatory Visit
Admission: RE | Admit: 2021-10-23 | Discharge: 2021-10-23 | Disposition: A | Discharge status: Home / Self Care | Payer: Medicare PPO | Source: Ambulatory Visit | Attending: Family Medicine | Admitting: Family Medicine

## 2021-10-23 DIAGNOSIS — Z1231 Encounter for screening mammogram for malignant neoplasm of breast: Secondary | ICD-10-CM | POA: Diagnosis present

## 2021-12-11 NOTE — Discharge Instructions (Signed)
Instructions after Total Knee Replacement   Nicole Jordan, Jr., M.D.     Dept. of Orthopaedics & Sports Medicine  [facility]  [address]  [place], [place]  [address]  Phone: [phone]   Fax: [phone]    DIET: Drink plenty of non-alcoholic fluids. Resume your normal diet. Include foods high in fiber.  ACTIVITY:  You may use crutches or a walker with weight-bearing as tolerated, unless instructed otherwise. You may be weaned off of the walker or crutches by your Physical Therapist.  Do NOT place pillows under the knee. Anything placed under the knee could limit your ability to straighten the knee.   Continue doing gentle exercises. Exercising will reduce the pain and swelling, increase motion, and prevent muscle weakness.   Please continue to use the TED compression stockings for 6 weeks. You may remove the stockings at night, but should reapply them in the morning. Do not drive or operate any equipment until instructed.  WOUND CARE:  Continue to use the PolarCare or ice packs periodically to reduce pain and swelling. You may bathe or shower after the staples are removed at the first office visit following surgery.  MEDICATIONS: You may resume your regular medications. Please take the pain medication as prescribed on the medication. Do not take pain medication on an empty stomach. You have been given a prescription for a blood thinner (Lovenox or Coumadin). Please take the medication as instructed. (NOTE: After completing a 2 week course of Lovenox, take one Enteric-coated aspirin once a day. This along with elevation will help reduce the possibility of phlebitis in your operated leg.) Do not drive or drink alcoholic beverages when taking pain medications.  CALL THE OFFICE FOR: Temperature above 101 degrees Excessive bleeding or drainage on the dressing. Excessive swelling, coldness, or paleness of the toes. Persistent nausea and vomiting.  FOLLOW-UP:  You  should have an appointment to return to the office in 10-14 days after surgery. Arrangements have been made for continuation of Physical Therapy (either home therapy or outpatient therapy).   [facility] Department Directory         [email or web address]       [email or web address]          Cardiology  Appointments: [place] - [phone] [place] - [phone]  Endocrinology  Appointments: [place] - [phone] [place] - [phone]  Gastroenterology  Appointments: [place] - [phone] [place] - [phone]        General Surgery   Appointments: [place] - [phone]  Internal Medicine/Family Medicine  Appointments: [place] - [phone] [place] - [phone] [place] - [phone]  Metabolic and Weigh Loss Surgery  Appointments: [place] - [phone]        Neurology  Appointments: [place] - [phone] [place] - [phone]  Neurosurgery  Appointments: [place] - [phone]  Obstetrics & Gynecology  Appointments: [place] - [phone] [place] - [phone]        Pediatrics  Appointments: [place] - [phone] [place] - [phone]  Physiatry  Appointments: [place] -[phone]  Physical Therapy  Appointments: [place] - [phone] [place] - [phone]        Podiatry  Appointments: [place] - [phone] [place] - [phone]  Pulmonology  Appointments: [place] - [phone]  Rheumatology  Appointments: [place] - [phone]         Location: [facility]  [address] , [place]  [address]  [place] Location: [facility] [address] [place], [place]  [address]  [place] Location: [facility] [address] [place], [place]  [address]    

## 2021-12-15 ENCOUNTER — Other Ambulatory Visit: Payer: Self-pay

## 2021-12-15 ENCOUNTER — Encounter
Admission: RE | Admit: 2021-12-15 | Discharge: 2021-12-15 | Disposition: A | Discharge status: Home / Self Care | Payer: Medicare PPO | Source: Ambulatory Visit | Attending: Orthopedic Surgery | Admitting: Orthopedic Surgery

## 2021-12-15 VITALS — BP 141/94 | HR 64 | Resp 16 | Ht 70.0 in | Wt 254.4 lb

## 2021-12-15 DIAGNOSIS — E119 Type 2 diabetes mellitus without complications: Secondary | ICD-10-CM | POA: Diagnosis not present

## 2021-12-15 DIAGNOSIS — M1712 Unilateral primary osteoarthritis, left knee: Secondary | ICD-10-CM | POA: Diagnosis not present

## 2021-12-15 DIAGNOSIS — Z01818 Encounter for other preprocedural examination: Secondary | ICD-10-CM | POA: Insufficient documentation

## 2021-12-15 DIAGNOSIS — Z01812 Encounter for preprocedural laboratory examination: Secondary | ICD-10-CM

## 2021-12-15 HISTORY — DX: Cardiac arrhythmia, unspecified: I49.9

## 2021-12-15 HISTORY — DX: Gastro-esophageal reflux disease without esophagitis: K21.9

## 2021-12-15 HISTORY — DX: Benign neoplasm of unspecified breast: D24.9

## 2021-12-15 HISTORY — DX: Supraventricular tachycardia: I47.1

## 2021-12-15 HISTORY — DX: Supraventricular tachycardia, unspecified: I47.10

## 2021-12-15 HISTORY — DX: Dizziness and giddiness: R42

## 2021-12-15 HISTORY — DX: Abnormal uterine and vaginal bleeding, unspecified: N93.9

## 2021-12-15 HISTORY — DX: Unspecified osteoarthritis, unspecified site: M19.90

## 2021-12-15 HISTORY — DX: Allergic rhinitis, unspecified: J30.9

## 2021-12-15 HISTORY — DX: Anxiety disorder, unspecified: F41.9

## 2021-12-15 HISTORY — DX: Essential (primary) hypertension: I10

## 2021-12-15 HISTORY — DX: Type 2 diabetes mellitus without complications: E11.9

## 2021-12-15 HISTORY — DX: Other hemorrhoids: K64.8

## 2021-12-15 HISTORY — DX: Sleep apnea, unspecified: G47.30

## 2021-12-15 HISTORY — DX: Hyperlipidemia, unspecified: E78.5

## 2021-12-15 LAB — TYPE AND SCREEN
ABO/RH(D): O NEG
Antibody Screen: NEGATIVE

## 2021-12-15 LAB — CBC
HCT: 39 % (ref 36.0–46.0)
Hemoglobin: 12.6 g/dL (ref 12.0–15.0)
MCH: 29.6 pg (ref 26.0–34.0)
MCHC: 32.3 g/dL (ref 30.0–36.0)
MCV: 91.5 fL (ref 80.0–100.0)
Platelets: 190 10*3/uL (ref 150–400)
RBC: 4.26 MIL/uL (ref 3.87–5.11)
RDW: 12.9 % (ref 11.5–15.5)
WBC: 3.3 10*3/uL — ABNORMAL LOW (ref 4.0–10.5)
nRBC: 0 % (ref 0.0–0.2)

## 2021-12-15 LAB — SURGICAL PCR SCREEN
MRSA, PCR: NEGATIVE
Staphylococcus aureus: NEGATIVE

## 2021-12-15 LAB — COMPREHENSIVE METABOLIC PANEL
ALT: 16 U/L (ref 0–44)
AST: 22 U/L (ref 15–41)
Albumin: 3.9 g/dL (ref 3.5–5.0)
Alkaline Phosphatase: 57 U/L (ref 38–126)
Anion gap: 7 (ref 5–15)
BUN: 13 mg/dL (ref 8–23)
CO2: 30 mmol/L (ref 22–32)
Calcium: 9.6 mg/dL (ref 8.9–10.3)
Chloride: 101 mmol/L (ref 98–111)
Creatinine, Ser: 0.97 mg/dL (ref 0.44–1.00)
GFR, Estimated: >60 mL/min (ref >60)
Glucose, Bld: 112 mg/dL — ABNORMAL HIGH (ref 70–99)
Potassium: 3.3 mmol/L — ABNORMAL LOW (ref 3.5–5.1)
Sodium: 138 mmol/L (ref 135–145)
Total Bilirubin: 0.7 mg/dL (ref 0.3–1.2)
Total Protein: 7.2 g/dL (ref 6.5–8.1)

## 2021-12-15 LAB — URINALYSIS, ROUTINE W REFLEX MICROSCOPIC
Bilirubin Urine: NEGATIVE
Glucose, UA: NEGATIVE mg/dL
Hgb urine dipstick: NEGATIVE
Ketones, ur: NEGATIVE mg/dL
Leukocytes,Ua: NEGATIVE
Nitrite: NEGATIVE
Protein, ur: NEGATIVE mg/dL
Specific Gravity, Urine: 1.004 — ABNORMAL LOW (ref 1.005–1.030)
pH: 7 (ref 5.0–8.0)

## 2021-12-15 LAB — HEMOGLOBIN A1C
Hgb A1c MFr Bld: 6.1 % — ABNORMAL HIGH (ref 4.8–5.6)
Mean Plasma Glucose: 128.37 mg/dL

## 2021-12-15 LAB — SEDIMENTATION RATE: Sed Rate: 16 mm/hr (ref 0–30)

## 2021-12-15 NOTE — Patient Instructions (Addendum)
Your procedure is scheduled on: 12/25/21 - Monday Report to the Registration Desk on the 1st floor of the [facility]. To find out your arrival time, please call [phone] between 1PM - 3PM on: 12/22/21 - Friday If your arrival time is 6:00 am, do not arrive prior to that time as the [place] [facility] entrance doors do not open until 6:00 am.  REMEMBER: Instructions that are not followed completely may result in serious medical risk, up to and including death; or upon the discretion of your surgeon and anesthesiologist your surgery may need to be rescheduled.  Do not eat food after midnight the night before surgery.  No gum chewing, lozengers or hard candies.  You may however, drink CLEAR liquids up to 2 hours before you are scheduled to arrive for your surgery. Do not drink anything within 2 hours of your scheduled arrival time.  Type 1 and Type 2 diabetics should only drink water.  In addition, your doctor has ordered for you to drink the provided  Gatorade G2 Drinking this carbohydrate drink up to two hours before surgery helps to reduce insulin resistance and improve patient outcomes. Please complete drinking 2 hours prior to scheduled arrival time.  TAKE THESE MEDICATIONS THE MORNING OF SURGERY WITH A SIP OF WATER: - metoprolol succinate (TOPROL-XL)  - omeprazole (PRILOSEC) - (take one the night before and one on the morning of surgery - helps to prevent nausea after surgery.) - rosuvastatin (CRESTOR)  - Difluprednate 0.05 % EMUL - fluticasone (FLONASE)    One week prior to surgery: Stop Anti-inflammatories (NSAIDS) such as Advil, Aleve, Ibuprofen, Motrin, Naproxen, Naprosyn and Aspirin based products such as Excedrin, Goodys Powder, BC Powder.  Stop beginning 12/18/21 , ANY OVER THE COUNTER supplements until after surgery.  You may take Tylenol if needed for pain up until the day of surgery.  No Alcohol for 24 hours before or after surgery.  No Smoking including  e-cigarettes for 24 hours prior to surgery.  No chewable tobacco products for at least 6 hours prior to surgery.  No nicotine patches on the day of surgery.  Do not use any "recreational" drugs for at least a week prior to your surgery.  Please be advised that the combination of cocaine and anesthesia may have negative outcomes, up to and including death. If you test positive for cocaine, your surgery will be cancelled.  On the morning of surgery brush your teeth with toothpaste and water, you may rinse your mouth with mouthwash if you wish. Do not swallow any toothpaste or mouthwash.  Use CHG Soap or wipes as directed on instruction sheet.  Do not wear jewelry, make-up, hairpins, clips or nail polish.  Do not wear lotions, powders, or perfumes.   Do not shave body from the neck down 48 hours prior to surgery just in case you cut yourself which could leave a site for infection.  Also, freshly shaved skin may become irritated if using the CHG soap.  Contact lenses, hearing aids and dentures may not be worn into surgery.  Do not bring valuables to the hospital. [facility] is not responsible for any missing/lost belongings or valuables.   Bring your C-PAP to the hospital with you in case you may have to spend the night.   Notify your doctor if there is any change in your medical condition (cold, fever, infection).  Wear comfortable clothing (specific to your surgery type) to the hospital.  After surgery, you can help prevent lung complications by  doing breathing exercises.  Take deep breaths and cough every 1-2 hours. Your doctor may order a device called an Incentive Spirometer to help you take deep breaths. When coughing or sneezing, hold a pillow firmly against your incision with both hands. This is called "splinting." Doing this helps protect your incision. It also decreases belly discomfort.  If you are being admitted to the hospital overnight, leave your suitcase in the  car. After surgery it may be brought to your room.  If you are being discharged the day of surgery, you will not be allowed to drive home. You will need a responsible adult (18 years or older) to drive you home and stay with you that night.   If you are taking public transportation, you will need to have a responsible adult (18 years or older) with you. Please confirm with your physician that it is acceptable to use public transportation.   Please call the [place] Dept. at [phone] if you have any questions about these instructions.  Surgery Visitation Policy:  Patients undergoing a surgery or procedure may have two family members or support persons with them as long as the person is not COVID-19 positive or experiencing its symptoms.   Inpatient Visitation:    Visiting hours are 7 a.m. to 8 p.m. Up to four visitors are allowed at one time in a patient room, including children. The visitors may rotate out with other people during the day. One designated support person (adult) may remain overnight.

## 2021-12-24 ENCOUNTER — Encounter: Payer: Self-pay | Admitting: Orthopedic Surgery

## 2021-12-24 DIAGNOSIS — J309 Allergic rhinitis, unspecified: Secondary | ICD-10-CM | POA: Insufficient documentation

## 2021-12-24 DIAGNOSIS — I471 Supraventricular tachycardia, unspecified: Secondary | ICD-10-CM | POA: Insufficient documentation

## 2021-12-24 DIAGNOSIS — G4730 Sleep apnea, unspecified: Secondary | ICD-10-CM | POA: Insufficient documentation

## 2021-12-24 DIAGNOSIS — E785 Hyperlipidemia, unspecified: Secondary | ICD-10-CM | POA: Insufficient documentation

## 2021-12-24 DIAGNOSIS — I10 Essential (primary) hypertension: Secondary | ICD-10-CM | POA: Insufficient documentation

## 2021-12-24 DIAGNOSIS — R739 Hyperglycemia, unspecified: Secondary | ICD-10-CM | POA: Insufficient documentation

## 2021-12-24 DIAGNOSIS — R42 Dizziness and giddiness: Secondary | ICD-10-CM | POA: Insufficient documentation

## 2021-12-24 DIAGNOSIS — Z780 Asymptomatic menopausal state: Secondary | ICD-10-CM | POA: Insufficient documentation

## 2021-12-24 DIAGNOSIS — K219 Gastro-esophageal reflux disease without esophagitis: Secondary | ICD-10-CM | POA: Insufficient documentation

## 2021-12-24 NOTE — H&P (Signed)
ORTHOPAEDIC HISTORY & PHYSICAL Formatting of this note is different from the original. [place] MEDICINE Chief Complaint:   Chief Complaint  Patient presents with  Knee Pain  H & P LEFT KNEE   History of Present Illness:   Nicole Jordan is a 76 y.o. female that presents to clinic today for her preoperative history and evaluation. Patient presents unaccompanied. The patient is scheduled to undergo a left total knee arthroplasty on 12/25/21 by Dr. Marry Guan. Her pain began many years ago. The pain is located primarily along the lateral aspect of the knee. She describes her pain as worse with weightbearing. She reports associated swelling with some giving way of the knee. She denies associated numbness or tingling, denies locking.   The patient's symptoms have progressed to the point that they decrease her quality of life. The patient has previously undergone conservative treatment including NSAIDS and injections to the knee without adequate control of her symptoms.  Denies significant cardiac history, history of DVT, or history of lumbar surgery. Patient will have her husband to help at home post-operatively.   Past Medical, Surgical, Family, Social History, Allergies, Medications:   Past Medical History:  Past Medical History:  Diagnosis Date  Allergic rhinitis  Diabetes mellitus type 2, uncomplicated (CMS-HCC)  Ganglion cyst of wrist  dorsal ganglionic cyst of left wrist removed in 2010  GERD (gastroesophageal reflux disease)  H/O dysfunctional uterine bleeding  HTN (hypertension)  Hyperglycemia  Hyperlipidemia  Intraductal papilloma of breast  Postmenopausal  Sleep apnea  CPAP  Supraventricular tachycardia (CMS-HCC)  Vertigo   Past Surgical History:  Past Surgical History:  Procedure Laterality Date  UVULOPALATOPLASTY 12/2002  for snoring  COLONOSCOPY 07/04/2004  Int Hemorrhoids, Diverticulosis: CBF 07/2014  WRIST GANGLION EXCISION Left  05/2008  Tdap 06/2010  Zostavax 04/2011  ARTHROSCOPIC ROTATOR CUFF REPAIR Right 11/04/2012  LEFT BREAST MASS EXCISION 2015  Dr. Tamala Julian  COLONOSCOPY 11/16/2014  Int Hemorrhoids: CBF 10/2024   Current Medications:  Current Outpatient Medications  Medication Sig Dispense Refill  ACCU-CHEK GUIDE TEST STRIPS test strip TEST ONCE DAILY 100 strip 3  aspirin 81 MG EC tablet Take 81 mg by mouth once daily.  blood glucose diagnostic, drum test strip Use once daily 100 each 5  blood glucose meter kit by XX route as directed 1 each 0  calcium carbonate-vitamin D3 (CALTRATE 600+D) 600 mg(1,573m) -400 unit tablet Take 1 tablet by mouth once daily.  cholecalciferol (VITAMIN D3) 1000 unit tablet Take 1,000 Units by mouth.  clobetasoL (CORMAX) 0.05 % external solution Apply 1 Application topically 2 (two) times daily  difluprednate (DUREZOL) 0.05 % ophthalmic emulsion Place 1 drop into both eyes 4 (four) times daily  fluticasone propionate (FLONASE) 50 mcg/actuation nasal spray SHAKE LIQUID AND USE 2 SPRAYS IN EACH NOSTRIL EVERY DAY 16 g 11  lancets Use 1 each once daily 100 each 5  lisinopriL (ZESTRIL) 10 MG tablet TAKE 1 TABLET(10 MG) BY MOUTH EVERY DAY 30 tablet 11  metoprolol succinate (TOPROL-XL) 50 MG XL tablet TAKE 1 AND 1/2 TABLETS BY MOUTH DAILY 135 tablet 3  minoxidiL 2.5 MG tablet Take 2.5 mg by mouth 2 (two) times daily  olopatadine (PATANASE) 0.6 % nasal spray USE ONE SPRAY IN EACH NOSTRIL TWICE DAILY 30.5 g 5  omeprazole (PRILOSEC) 40 MG DR capsule Take 1 capsule (40 mg total) by mouth once daily 30 capsule 5  rosuvastatin (CRESTOR) 5 MG tablet TAKE ONE TABLET BY MOUTH DAILY 90 tablet 3  triamterene-hydroCHLOROthiazide (MAXZIDE-25) 37.5-25 mg tablet TAKE 1 TABLET BY MOUTH EVERY DAY 30 tablet 11  prednisoLONE acetate (PRED FORTE) 1 % ophthalmic suspension Place 1 drop into both eyes 6 (six) times daily (Patient not taking: Reported on 12/15/2021)   No current facility-administered medications  for this visit.   Allergies:  Allergies  Allergen Reactions  Adhesive Tape-Silicones Other (See Comments)  Blister- Paper tape is OK  Metronidazole Itching  Severe itching and burning  Adhes. Band-Tape-Benzalkonium Other (See Comments)  Leaves scars  Flagyl [Metronidazole Hcl] Itching  Nitroimidazoles Itching   Social History:  Social History   Socioeconomic History  Marital status: Married  Spouse name: Hydrographic surveyor  Number of children: 2  Years of education: 64  Occupational History  Occupation: Retired Architectural technologist  Tobacco Use  Smoking status: Former  Types: Cigarettes  Smokeless tobacco: Never  Tobacco comments:  Quit 52 + years ago  Surveyor, mining Use: Never used  Substance and Sexual Activity  Alcohol use: No  Alcohol/week: 0.0 standard drinks  Drug use: No  Sexual activity: Yes  Partners: Male   Family History:  Family History  Problem Relation Age of Onset  High blood pressure (Hypertension) Mother  Breast cancer Other  Sibling  Allergies Other  Sibling  Breast cancer Daughter 13  Ovarian cancer Neg Hx   Review of Systems:   A 10+ ROS was performed, reviewed, and the pertinent orthopaedic findings are documented in the HPI.   Physical Examination:   BP 130/80 (BP Location: Left upper arm, Patient Position: Sitting, BP Cuff Size: Large Adult)  Ht 177.8 cm (_0 )  Wt (!) 115.4 kg (254 lb 6.4 oz)  BMI 36.50 kg/m   Patient is a well-developed, well-nourished female in no acute distress. Patient has normal mood and affect. Patient is alert and oriented to person, place, and time.   HEENT: Atraumatic, normocephalic. Pupils equal and reactive to light. Extraocular motion intact. Noninjected sclera.  Cardiovascular: Regular rate and rhythm, with no murmurs, rubs, or gallops. Distal pulses palpable. No carotid bruits.  Respiratory: Lungs clear to auscultation bilaterally.   Left Knee: Soft tissue swelling: mild Effusion: minimal Erythema:  none Crepitance: mild Tenderness: lateral Alignment: relative valgus Mediolateral laxity: lateral pseudolaxity Posterior sag: negative Patellar tracking: Good tracking without evidence of subluxation or tilt Atrophy: No significant atrophy.  Quadriceps tone was fair to good. Range of motion: 0/0/121 degrees  Patient able to actively dorsiflex and plantarflex left ankle. Able to flex and extend the toes.  Sensation intact over the saphenous, lateral sural cutaneous, superficial fibular, and deep fibular nerve distributions.  Tests Performed/Reviewed:  X-rays  X-ray knee left 3 views  Result Date: 12/15/2021 Anteroposterior, lateral, and sunrise views of the left knee were obtained. Images reveal moderate loss of both medial and lateral compartment joint space with significant osteophyte formation noted. No fractures or other osseous abnormality noted.   I personally ordered and interpreted today's x-rays.  Impression:   ICD-10-CM  1. Primary osteoarthritis of left knee M17.12   Plan:   The patient has end-stage degenerative changes of the left knee. It was explained to the patient that the condition is progressive in nature. Having failed conservative treatment, the patient has elected to proceed with a total joint arthroplasty. The patient will undergo a total joint arthroplasty with Dr. Marry Guan. The risks of surgery, including blood clot and infection, were discussed with the patient. Measures to reduce these risks, including the use of anticoagulation, perioperative antibiotics, and  early ambulation were discussed. The importance of postoperative physical therapy was discussed with the patient. The patient elects to proceed with surgery. The patient is instructed to stop all blood thinners prior to surgery. The patient is instructed to call the hospital the day before surgery to learn of the proper arrival time.   Contact our office with any questions or concerns. Follow up as  indicated, or sooner should any new problems arise, if conditions worsen, or if they are otherwise concerned.   Gwenlyn Fudge, [facility] [place], [place] [address] Phone: [phone]  This note was generated in part with voice recognition software and I apologize for any typographical errors that were not detected and corrected.  Electronically signed by Gwenlyn Fudge, PA at 12/15/2021 4:55 PM EDT

## 2021-12-25 ENCOUNTER — Observation Stay: Payer: Medicare PPO

## 2021-12-25 ENCOUNTER — Ambulatory Visit: Payer: Medicare PPO | Admitting: Urgent Care

## 2021-12-25 ENCOUNTER — Observation Stay
Admission: RE | Admit: 2021-12-25 | Discharge: 2021-12-26 | Disposition: A | Discharge status: Home / Self Care | Payer: Medicare PPO | Attending: Orthopedic Surgery | Admitting: Orthopedic Surgery

## 2021-12-25 ENCOUNTER — Encounter: Payer: Self-pay | Admitting: Orthopedic Surgery

## 2021-12-25 ENCOUNTER — Other Ambulatory Visit: Payer: Self-pay

## 2021-12-25 ENCOUNTER — Encounter
Admission: RE | Disposition: A | Discharge status: Home / Self Care | Payer: Self-pay | Source: Home / Self Care | Attending: Orthopedic Surgery

## 2021-12-25 ENCOUNTER — Ambulatory Visit: Payer: Medicare PPO | Admitting: Anesthesiology

## 2021-12-25 DIAGNOSIS — M1712 Unilateral primary osteoarthritis, left knee: Principal | ICD-10-CM | POA: Insufficient documentation

## 2021-12-25 DIAGNOSIS — Z7982 Long term (current) use of aspirin: Secondary | ICD-10-CM | POA: Insufficient documentation

## 2021-12-25 DIAGNOSIS — Z79899 Other long term (current) drug therapy: Secondary | ICD-10-CM | POA: Diagnosis not present

## 2021-12-25 DIAGNOSIS — E119 Type 2 diabetes mellitus without complications: Secondary | ICD-10-CM | POA: Diagnosis not present

## 2021-12-25 DIAGNOSIS — Z96659 Presence of unspecified artificial knee joint: Secondary | ICD-10-CM

## 2021-12-25 DIAGNOSIS — Z87891 Personal history of nicotine dependence: Secondary | ICD-10-CM | POA: Diagnosis not present

## 2021-12-25 DIAGNOSIS — I10 Essential (primary) hypertension: Secondary | ICD-10-CM | POA: Diagnosis not present

## 2021-12-25 DIAGNOSIS — Z01812 Encounter for preprocedural laboratory examination: Secondary | ICD-10-CM

## 2021-12-25 HISTORY — PX: KNEE ARTHROPLASTY: SHX992

## 2021-12-25 LAB — C-REACTIVE PROTEIN: CRP: 0.6 mg/dL (ref <1.0)

## 2021-12-25 LAB — GLUCOSE, CAPILLARY
Glucose-Capillary: 147 mg/dL — ABNORMAL HIGH (ref 70–99)
Glucose-Capillary: 163 mg/dL — ABNORMAL HIGH (ref 70–99)
Glucose-Capillary: 178 mg/dL — ABNORMAL HIGH (ref 70–99)

## 2021-12-25 LAB — ABO/RH: ABO/RH(D): O NEG

## 2021-12-25 SURGERY — ARTHROPLASTY, KNEE, TOTAL, USING IMAGELESS COMPUTER-ASSISTED NAVIGATION
Anesthesia: Spinal | Site: Knee | Laterality: Left

## 2021-12-25 MED ORDER — SODIUM CHLORIDE FLUSH 0.9 % IV SOLN
INTRAVENOUS | Status: AC
  Filled 2021-12-25: qty 40

## 2021-12-25 MED ORDER — CEFAZOLIN SODIUM-DEXTROSE 2-4 GM/100ML-% IV SOLN
2.0000 g | INTRAVENOUS | Status: AC
  Administered 2021-12-25: 2 g via INTRAVENOUS

## 2021-12-25 MED ORDER — CHLORHEXIDINE GLUCONATE 0.12 % MT SOLN: 15.0000 mL | Freq: Once | OROMUCOSAL | Status: AC

## 2021-12-25 MED ORDER — LISINOPRIL 10 MG PO TABS
10.0000 mg | ORAL_TABLET | Freq: Every day | ORAL | Status: DC
  Administered 2021-12-25: 10 mg via ORAL
  Filled 2021-12-25 (×2): qty 1

## 2021-12-25 MED ORDER — DEXAMETHASONE SODIUM PHOSPHATE 10 MG/ML IJ SOLN: 8.0000 mg | Freq: Once | INTRAMUSCULAR | Status: AC

## 2021-12-25 MED ORDER — OXYCODONE HCL 5 MG PO TABS: 5.0000 mg | ORAL_TABLET | ORAL | Status: DC | PRN

## 2021-12-25 MED ORDER — ONDANSETRON HCL 4 MG/2ML IJ SOLN: 4.0000 mg | Freq: Four times a day (QID) | INTRAMUSCULAR | Status: DC | PRN

## 2021-12-25 MED ORDER — SENNOSIDES-DOCUSATE SODIUM 8.6-50 MG PO TABS
1.0000 | ORAL_TABLET | Freq: Two times a day (BID) | ORAL | Status: DC
  Administered 2021-12-25 – 2021-12-26 (×3): 1 via ORAL
  Filled 2021-12-25 (×3): qty 1

## 2021-12-25 MED ORDER — MAGNESIUM HYDROXIDE 400 MG/5ML PO SUSP
30.0000 mL | Freq: Every day | ORAL | Status: DC
  Administered 2021-12-25 – 2021-12-26 (×2): 30 mL via ORAL
  Filled 2021-12-25 (×2): qty 30

## 2021-12-25 MED ORDER — FLEET ENEMA 7-19 GM/118ML RE ENEM: 1.0000 | ENEMA | Freq: Once | RECTAL | Status: DC | PRN

## 2021-12-25 MED ORDER — DIFLUPREDNATE 0.05 % OP EMUL: 1.0000 [drp] | Freq: Four times a day (QID) | OPHTHALMIC | Status: DC

## 2021-12-25 MED ORDER — CELECOXIB 200 MG PO CAPS: 400.0000 mg | ORAL_CAPSULE | Freq: Once | ORAL | Status: AC

## 2021-12-25 MED ORDER — MIDAZOLAM HCL 2 MG/2ML IJ SOLN
INTRAMUSCULAR | Status: AC
  Filled 2021-12-25: qty 2

## 2021-12-25 MED ORDER — PHENOL 1.4 % MT LIQD: 1.0000 | OROMUCOSAL | Status: DC | PRN

## 2021-12-25 MED ORDER — ACETAMINOPHEN 325 MG PO TABS: 325.0000 mg | ORAL_TABLET | Freq: Four times a day (QID) | ORAL | Status: DC | PRN

## 2021-12-25 MED ORDER — 0.9 % SODIUM CHLORIDE (POUR BTL) OPTIME
TOPICAL | Status: DC | PRN
  Administered 2021-12-25: 500 mL

## 2021-12-25 MED ORDER — CEFAZOLIN SODIUM-DEXTROSE 2-4 GM/100ML-% IV SOLN
2.0000 g | Freq: Four times a day (QID) | INTRAVENOUS | Status: AC
  Administered 2021-12-25 (×2): 2 g via INTRAVENOUS
  Filled 2021-12-25 (×2): qty 100

## 2021-12-25 MED ORDER — MENTHOL 3 MG MT LOZG: 1.0000 | LOZENGE | OROMUCOSAL | Status: DC | PRN

## 2021-12-25 MED ORDER — ACETAMINOPHEN 10 MG/ML IV SOLN
INTRAVENOUS | Status: DC | PRN
  Administered 2021-12-25: 1000 mg via INTRAVENOUS

## 2021-12-25 MED ORDER — ACETAMINOPHEN 10 MG/ML IV SOLN
1000.0000 mg | Freq: Four times a day (QID) | INTRAVENOUS | Status: DC
  Administered 2021-12-25 (×2): 1000 mg via INTRAVENOUS
  Filled 2021-12-25 (×2): qty 100

## 2021-12-25 MED ORDER — GLYCOPYRROLATE 0.2 MG/ML IJ SOLN
INTRAMUSCULAR | Status: DC | PRN
  Administered 2021-12-25: .2 mg via INTRAVENOUS

## 2021-12-25 MED ORDER — CELECOXIB 200 MG PO CAPS
ORAL_CAPSULE | ORAL | Status: AC
  Administered 2021-12-25: 400 mg via ORAL
  Filled 2021-12-25: qty 2

## 2021-12-25 MED ORDER — TRAMADOL HCL 50 MG PO TABS
50.0000 mg | ORAL_TABLET | ORAL | Status: DC | PRN
  Filled 2021-12-25: qty 2

## 2021-12-25 MED ORDER — OXYCODONE HCL 5 MG PO TABS: 10.0000 mg | ORAL_TABLET | ORAL | Status: DC | PRN

## 2021-12-25 MED ORDER — HYDROMORPHONE HCL 1 MG/ML IJ SOLN: 0.5000 mg | INTRAMUSCULAR | Status: DC | PRN

## 2021-12-25 MED ORDER — PROPOFOL 1000 MG/100ML IV EMUL
INTRAVENOUS | Status: AC
  Filled 2021-12-25: qty 100

## 2021-12-25 MED ORDER — GABAPENTIN 300 MG PO CAPS: 300.0000 mg | ORAL_CAPSULE | Freq: Once | ORAL | Status: AC

## 2021-12-25 MED ORDER — DEXAMETHASONE SODIUM PHOSPHATE 10 MG/ML IJ SOLN
INTRAMUSCULAR | Status: AC
  Administered 2021-12-25: 8 mg via INTRAVENOUS
  Filled 2021-12-25: qty 1

## 2021-12-25 MED ORDER — ENOXAPARIN SODIUM 30 MG/0.3ML IJ SOSY
30.0000 mg | PREFILLED_SYRINGE | Freq: Two times a day (BID) | INTRAMUSCULAR | Status: DC
  Administered 2021-12-26: 30 mg via SUBCUTANEOUS
  Filled 2021-12-25: qty 0.3

## 2021-12-25 MED ORDER — BUPIVACAINE HCL (PF) 0.25 % IJ SOLN
INTRAMUSCULAR | Status: AC
  Filled 2021-12-25: qty 60

## 2021-12-25 MED ORDER — MIDAZOLAM HCL 2 MG/2ML IJ SOLN
INTRAMUSCULAR | Status: DC | PRN
  Administered 2021-12-25: 2 mg via INTRAVENOUS

## 2021-12-25 MED ORDER — ROSUVASTATIN CALCIUM 5 MG PO TABS: 5.0000 mg | ORAL_TABLET | ORAL | Status: DC

## 2021-12-25 MED ORDER — TRANEXAMIC ACID-NACL 1000-0.7 MG/100ML-% IV SOLN
INTRAVENOUS | Status: AC
  Filled 2021-12-25: qty 100

## 2021-12-25 MED ORDER — METOCLOPRAMIDE HCL 5 MG PO TABS
10.0000 mg | ORAL_TABLET | Freq: Three times a day (TID) | ORAL | Status: DC
  Administered 2021-12-25 – 2021-12-26 (×3): 10 mg via ORAL
  Filled 2021-12-25 (×3): qty 2

## 2021-12-25 MED ORDER — CEFAZOLIN SODIUM-DEXTROSE 2-4 GM/100ML-% IV SOLN
INTRAVENOUS | Status: AC
  Filled 2021-12-25: qty 100

## 2021-12-25 MED ORDER — GABAPENTIN 300 MG PO CAPS
ORAL_CAPSULE | ORAL | Status: AC
  Administered 2021-12-25: 300 mg via ORAL
  Filled 2021-12-25: qty 1

## 2021-12-25 MED ORDER — SODIUM CHLORIDE 0.9 % IR SOLN
Status: DC | PRN
  Administered 2021-12-25: 3000 mL

## 2021-12-25 MED ORDER — TRANEXAMIC ACID-NACL 1000-0.7 MG/100ML-% IV SOLN: 1000.0000 mg | Freq: Once | INTRAVENOUS | Status: AC

## 2021-12-25 MED ORDER — TRANEXAMIC ACID-NACL 1000-0.7 MG/100ML-% IV SOLN
INTRAVENOUS | Status: AC
  Administered 2021-12-25: 1000 mg via INTRAVENOUS
  Filled 2021-12-25: qty 100

## 2021-12-25 MED ORDER — SURGIPHOR WOUND IRRIGATION SYSTEM - OPTIME
TOPICAL | Status: DC | PRN
  Administered 2021-12-25: 1 via TOPICAL

## 2021-12-25 MED ORDER — CHLORHEXIDINE GLUCONATE 4 % EX LIQD: 60.0000 mL | Freq: Once | CUTANEOUS | Status: DC

## 2021-12-25 MED ORDER — LACTATED RINGERS IV SOLN: INTRAVENOUS | Status: DC | PRN

## 2021-12-25 MED ORDER — PANTOPRAZOLE SODIUM 40 MG PO TBEC
40.0000 mg | DELAYED_RELEASE_TABLET | Freq: Two times a day (BID) | ORAL | Status: DC
  Administered 2021-12-25 – 2021-12-26 (×3): 40 mg via ORAL
  Filled 2021-12-25 (×3): qty 1

## 2021-12-25 MED ORDER — INSULIN ASPART 100 UNIT/ML IJ SOLN: 0.0000 [IU] | Freq: Every day | INTRAMUSCULAR | Status: DC

## 2021-12-25 MED ORDER — PHENYLEPHRINE HCL-NACL 20-0.9 MG/250ML-% IV SOLN
INTRAVENOUS | Status: DC | PRN
  Administered 2021-12-25: 30 ug/min via INTRAVENOUS

## 2021-12-25 MED ORDER — SODIUM CHLORIDE 0.9 % IV SOLN: INTRAVENOUS | Status: DC

## 2021-12-25 MED ORDER — CLOBETASOL PROPIONATE 0.05 % EX CREA: 1.0000 | TOPICAL_CREAM | Freq: Two times a day (BID) | CUTANEOUS | Status: DC | PRN

## 2021-12-25 MED ORDER — PHENYLEPHRINE HCL (PRESSORS) 10 MG/ML IV SOLN
INTRAVENOUS | Status: AC
  Filled 2021-12-25: qty 1

## 2021-12-25 MED ORDER — CELECOXIB 200 MG PO CAPS
200.0000 mg | ORAL_CAPSULE | Freq: Two times a day (BID) | ORAL | Status: DC
  Administered 2021-12-26: 200 mg via ORAL
  Filled 2021-12-25: qty 1

## 2021-12-25 MED ORDER — VITAMIN D 25 MCG (1000 UNIT) PO TABS
5000.0000 [IU] | ORAL_TABLET | Freq: Every day | ORAL | Status: DC
  Administered 2021-12-25 – 2021-12-26 (×2): 5000 [IU] via ORAL
  Filled 2021-12-25 (×2): qty 5

## 2021-12-25 MED ORDER — TRIAMTERENE-HCTZ 37.5-25 MG PO TABS
1.0000 | ORAL_TABLET | Freq: Every day | ORAL | Status: DC
  Administered 2021-12-25 – 2021-12-26 (×2): 1 via ORAL
  Filled 2021-12-25 (×2): qty 1

## 2021-12-25 MED ORDER — BUPIVACAINE LIPOSOME 1.3 % IJ SUSP
INTRAMUSCULAR | Status: AC
  Filled 2021-12-25: qty 20

## 2021-12-25 MED ORDER — METOPROLOL SUCCINATE ER 50 MG PO TB24
75.0000 mg | ORAL_TABLET | Freq: Every day | ORAL | Status: DC
  Filled 2021-12-25: qty 2

## 2021-12-25 MED ORDER — BISACODYL 10 MG RE SUPP: 10.0000 mg | Freq: Every day | RECTAL | Status: DC | PRN

## 2021-12-25 MED ORDER — FENTANYL CITRATE (PF) 100 MCG/2ML IJ SOLN: 25.0000 ug | INTRAMUSCULAR | Status: DC | PRN

## 2021-12-25 MED ORDER — INSULIN ASPART 100 UNIT/ML IJ SOLN: 0.0000 [IU] | Freq: Three times a day (TID) | INTRAMUSCULAR | Status: DC

## 2021-12-25 MED ORDER — CHLORHEXIDINE GLUCONATE 0.12 % MT SOLN
OROMUCOSAL | Status: AC
  Administered 2021-12-25: 15 mL via OROMUCOSAL
  Filled 2021-12-25: qty 15

## 2021-12-25 MED ORDER — SODIUM CHLORIDE 0.9 % IV SOLN
INTRAVENOUS | Status: DC | PRN
  Administered 2021-12-25: 60 mL

## 2021-12-25 MED ORDER — TRANEXAMIC ACID-NACL 1000-0.7 MG/100ML-% IV SOLN
1000.0000 mg | INTRAVENOUS | Status: AC
  Administered 2021-12-25: 1000 mg via INTRAVENOUS

## 2021-12-25 MED ORDER — DIPHENHYDRAMINE HCL 12.5 MG/5ML PO ELIX: 12.5000 mg | ORAL_SOLUTION | ORAL | Status: DC | PRN

## 2021-12-25 MED ORDER — ONDANSETRON HCL 4 MG PO TABS: 4.0000 mg | ORAL_TABLET | Freq: Four times a day (QID) | ORAL | Status: DC | PRN

## 2021-12-25 MED ORDER — MINOXIDIL 2.5 MG PO TABS
1.2500 mg | ORAL_TABLET | Freq: Two times a day (BID) | ORAL | Status: DC
  Administered 2021-12-25 – 2021-12-26 (×3): 1.25 mg via ORAL
  Filled 2021-12-25 (×3): qty 0.5

## 2021-12-25 MED ORDER — BUPIVACAINE HCL (PF) 0.25 % IJ SOLN
INTRAMUSCULAR | Status: DC | PRN
  Administered 2021-12-25: 60 mL

## 2021-12-25 MED ORDER — ALUM & MAG HYDROXIDE-SIMETH 200-200-20 MG/5ML PO SUSP: 30.0000 mL | ORAL | Status: DC | PRN

## 2021-12-25 MED ORDER — FERROUS SULFATE 325 (65 FE) MG PO TABS
325.0000 mg | ORAL_TABLET | Freq: Two times a day (BID) | ORAL | Status: DC
  Administered 2021-12-25 – 2021-12-26 (×2): 325 mg via ORAL
  Filled 2021-12-25 (×2): qty 1

## 2021-12-25 MED ORDER — FLUTICASONE PROPIONATE 50 MCG/ACT NA SUSP: 2.0000 | Freq: Every day | NASAL | Status: DC | PRN

## 2021-12-25 MED ORDER — PROPOFOL 500 MG/50ML IV EMUL
INTRAVENOUS | Status: DC | PRN
  Administered 2021-12-25: 200 ug/kg/min via INTRAVENOUS

## 2021-12-25 MED ORDER — ONDANSETRON HCL 4 MG/2ML IJ SOLN: 4.0000 mg | Freq: Once | INTRAMUSCULAR | Status: DC | PRN

## 2021-12-25 MED ORDER — BUPIVACAINE HCL (PF) 0.5 % IJ SOLN
INTRAMUSCULAR | Status: DC | PRN
  Administered 2021-12-25: 3 mL

## 2021-12-25 MED ORDER — ORAL CARE MOUTH RINSE: 15.0000 mL | Freq: Once | OROMUCOSAL | Status: AC

## 2021-12-25 MED ORDER — ACETAMINOPHEN 10 MG/ML IV SOLN
INTRAVENOUS | Status: AC
  Filled 2021-12-25: qty 100

## 2021-12-25 SURGICAL SUPPLY — 79 items
ATTUNE MED DOME PAT 41 KNEE (Knees) IMPLANT
ATTUNE PS FEM LT SZ 7 CEM KNEE (Femur) IMPLANT
ATTUNE PSRP INSR SZ7 12 KNEE (Insert) IMPLANT
BASE TIBIAL ROT PLAT SZ 8 KNEE (Knees) IMPLANT
BATTERY INSTRU NAVIGATION (MISCELLANEOUS) ×4 IMPLANT
BLADE SAW 70X12.5 (BLADE) ×1 IMPLANT
BLADE SAW 90X13X1.19 OSCILLAT (BLADE) ×1 IMPLANT
BLADE SAW 90X25X1.19 OSCILLAT (BLADE) ×1 IMPLANT
BONE CEMENT GENTAMICIN (Cement) ×1 IMPLANT
CEMENT BONE GENTAMICIN 40 (Cement) IMPLANT
COOLER POLAR GLACIER W/PUMP (MISCELLANEOUS) ×1 IMPLANT
CUFF TOURN SGL QUICK 24 (TOURNIQUET CUFF)
CUFF TOURN SGL QUICK 34 (TOURNIQUET CUFF)
CUFF TRNQT CYL 24X4X16.5-23 (TOURNIQUET CUFF) IMPLANT
CUFF TRNQT CYL 34X4.125X (TOURNIQUET CUFF) IMPLANT
DRAPE 3/4 80X56 (DRAPES) ×1 IMPLANT
DRAPE INCISE IOBAN 66X45 STRL (DRAPES) IMPLANT
DRSG DERMACEA 8X12 NADH (GAUZE/BANDAGES/DRESSINGS) IMPLANT
DRSG DERMACEA NONADH 3X8 (GAUZE/BANDAGES/DRESSINGS) ×1 IMPLANT
DRSG MEPILEX SACRM 8.7X9.8 (GAUZE/BANDAGES/DRESSINGS) ×1 IMPLANT
DRSG OPSITE POSTOP 4X14 (GAUZE/BANDAGES/DRESSINGS) ×1 IMPLANT
DRSG TEGADERM 4X4.75 (GAUZE/BANDAGES/DRESSINGS) ×1 IMPLANT
DURAPREP 26ML APPLICATOR (WOUND CARE) ×2 IMPLANT
ELECT CAUTERY BLADE 6.4 (BLADE) ×1 IMPLANT
ELECT REM PT RETURN 9FT ADLT (ELECTROSURGICAL) ×1
ELECTRODE REM PT RTRN 9FT ADLT (ELECTROSURGICAL) ×1 IMPLANT
EX-PIN ORTHOLOCK NAV 4X150 (PIN) ×2 IMPLANT
GLOVE BIO SURGEON STRL SZ7 (GLOVE) ×2 IMPLANT
GLOVE BIOGEL M STRL SZ7.5 (GLOVE) ×2 IMPLANT
GLOVE BIOGEL PI IND STRL 8 (GLOVE) ×1 IMPLANT
GLOVE PI ORTHO PRO STRL 7.5 (GLOVE) ×2 IMPLANT
GLOVE SURG UNDER POLY LF SZ7.5 (GLOVE) ×1 IMPLANT
GOWN STRL REUS W/ TWL LRG LVL3 (GOWN DISPOSABLE) ×2 IMPLANT
GOWN STRL REUS W/ TWL XL LVL3 (GOWN DISPOSABLE) ×1 IMPLANT
GOWN STRL REUS W/TWL LRG LVL3 (GOWN DISPOSABLE) ×2
GOWN STRL REUS W/TWL XL LVL3 (GOWN DISPOSABLE) ×1
HEMOVAC 400CC 10FR (MISCELLANEOUS) ×1 IMPLANT
HOLDER FOLEY CATH W/STRAP (MISCELLANEOUS) ×1 IMPLANT
HOLSTER ELECTROSUGICAL PENCIL (MISCELLANEOUS) ×1 IMPLANT
HOOD PEEL AWAY FLYTE STAYCOOL (MISCELLANEOUS) ×2 IMPLANT
IV NS IRRIG 3000ML ARTHROMATIC (IV SOLUTION) ×1 IMPLANT
KIT TURNOVER KIT A (KITS) ×1 IMPLANT
KNIFE SCULPS 14X20 (INSTRUMENTS) ×1 IMPLANT
MANIFOLD NEPTUNE II (INSTRUMENTS) ×2 IMPLANT
NDL SPNL 20GX3.5 QUINCKE YW (NEEDLE) ×2 IMPLANT
NEEDLE SPNL 20GX3.5 QUINCKE YW (NEEDLE) ×2 IMPLANT
NS IRRIG 500ML POUR BTL (IV SOLUTION) ×1 IMPLANT
PACK TOTAL KNEE (MISCELLANEOUS) ×1 IMPLANT
PAD ABD DERMACEA PRESS 5X9 (GAUZE/BANDAGES/DRESSINGS) ×2 IMPLANT
PAD WRAPON POLAR KNEE (MISCELLANEOUS) ×1 IMPLANT
PAD WRAPON POLOR MULTI XL (MISCELLANEOUS) IMPLANT
PIN DRILL FIX HALF THREAD (BIT) ×2 IMPLANT
PIN DRILL QUICK PACK ×2 IMPLANT
PIN FIXATION 1/8DIA X 3INL (PIN) ×1 IMPLANT
PULSAVAC PLUS IRRIG FAN TIP (DISPOSABLE) ×1
SOL PREP PVP 2OZ (MISCELLANEOUS) ×1
SOLUTION IRRIG SURGIPHOR (IV SOLUTION) ×1 IMPLANT
SOLUTION PREP PVP 2OZ (MISCELLANEOUS) ×1 IMPLANT
SPONGE DRAIN TRACH 4X4 STRL 2S (GAUZE/BANDAGES/DRESSINGS) ×1 IMPLANT
STAPLER SKIN PROX 35W (STAPLE) ×1 IMPLANT
STOCKINETTE IMPERV 14X48 (MISCELLANEOUS) IMPLANT
STRAP TIBIA SHORT (MISCELLANEOUS) ×1 IMPLANT
SUCTION FRAZIER HANDLE 10FR (MISCELLANEOUS) ×1
SUCTION TUBE FRAZIER 10FR DISP (MISCELLANEOUS) ×1 IMPLANT
SUT VIC AB 0 CT1 36 (SUTURE) ×2 IMPLANT
SUT VIC AB 1 CT1 36 (SUTURE) ×2 IMPLANT
SUT VIC AB 2-0 CT2 27 (SUTURE) ×1 IMPLANT
SYR 30ML LL (SYRINGE) ×2 IMPLANT
TIBIAL BASE ROT PLAT SZ 8 KNEE (Knees) ×1 IMPLANT
TIP FAN IRRIG PULSAVAC PLUS (DISPOSABLE) ×1 IMPLANT
TOWEL OR 17X26 4PK STRL BLUE (TOWEL DISPOSABLE) IMPLANT
TOWER CARTRIDGE SMART MIX (DISPOSABLE) ×1 IMPLANT
TRAP FLUID SMOKE EVACUATOR (MISCELLANEOUS) ×1 IMPLANT
TRAY FOLEY MTR SLVR 16FR STAT (SET/KITS/TRAYS/PACK) ×1 IMPLANT
WATER STERILE IRR 1000ML POUR (IV SOLUTION) IMPLANT
WATER STERILE IRR 500ML POUR (IV SOLUTION) ×1 IMPLANT
WRAP-ON POLOR PAD MULTI XL (MISCELLANEOUS) ×1
WRAPON POLAR PAD KNEE (MISCELLANEOUS) ×1
WRAPON POLOR PAD MULTI XL (MISCELLANEOUS) ×1

## 2021-12-25 NOTE — Evaluation (Signed)
Physical Therapy Evaluation Patient Details Name: Nicole Jordan MRN: [id number] DOB: September 06, 1945 Today's Date: 12/25/2021  History of Present Illness  Pt is 89 YOF admitted for L TKA. PMH includes: ganglion cyst, GERD, hx of dysfunctional uterine bleeding, HTN, hyperglycemia, HLD, SVT, sleep apnea.  Clinical Impression  Pt presents to PT in bed and agreeable to participate in therapy services. Pt was pleasant and motivated to participate during the session and put forth good effort throughout. Pt requires no physical assistance for functional mobility during evaluation. Does require occasional cueing for hand and foot placement and management of RW. Pt demonstrates ability to perform SLR with independence, therefore does not require KI for mobility. Reports some lightheadedness following amb to chair, BP seated 132/84 mmHg. L knee AROM: -2-89*, reports hyperextension at baseline, MD notified. Would benefit from skilled HHPT to address above deficits in strength, ROM, balance, gait mechanics, and activity tolerance to promote optimal return to PLOF.         Recommendations for follow up therapy are one component of a multi-disciplinary discharge planning process, led by the attending physician.  Recommendations may be updated based on patient status, additional functional criteria and insurance authorization.  Follow Up Recommendations Home health PT      Assistance Recommended at Discharge Intermittent Supervision/Assistance  Patient can return home with the following  A little help with walking and/or transfers;A little help with bathing/dressing/bathroom;Assistance with cooking/housework;Assist for transportation;Help with stairs or ramp for entrance    Equipment Recommendations None recommended by PT  Recommendations for Other Services       Functional Status Assessment Patient has had a recent decline in their functional status and demonstrates the ability to make significant  improvements in function in a reasonable and predictable amount of time.     Precautions / Restrictions Precautions Precautions: Knee;Fall Precaution Booklet Issued: Yes (comment) Restrictions Weight Bearing Restrictions: Yes LLE Weight Bearing: Weight bearing as tolerated      Mobility  Bed Mobility Overal bed mobility: Needs Assistance Bed Mobility: Supine to Sit     Supine to sit: Supervision          Transfers Overall transfer level: Needs assistance Equipment used: Rolling walker (2 wheels) Transfers: Sit to/from Stand Sit to Stand: Min guard                Ambulation/Gait Ambulation/Gait assistance: Min guard Gait Distance (Feet): 10 Feet Assistive device: Rolling walker (2 wheels) Gait Pattern/deviations: Step-to pattern, Decreased stance time - left, Decreased step length - right, Antalgic Gait velocity: decr     General Gait Details: slow, effortful steps, occasional cueing for hand, foot placement, and sequencing and for RW management  Stairs            Wheelchair Mobility    Modified Rankin (Stroke Patients Only)       Balance Overall balance assessment: Needs assistance Sitting-balance support: Single extremity supported, Feet unsupported Sitting balance-Leahy Scale: Good     Standing balance support: Bilateral upper extremity supported, During functional activity, Reliant on assistive device for balance Standing balance-Leahy Scale: Fair                               Pertinent Vitals/Pain Pain Assessment Pain Assessment: No/denies pain    Home Living Family/patient expects to be discharged to:: Private residence Living Arrangements: Spouse/significant other Available Help at Discharge: Family;Available 24 hours/day Type of Home: House Home Access: Stairs to enter  Entrance Stairs-Rails: Left Entrance Stairs-Number of Steps: 4-5 STE   Home Layout: Two level;Able to live on main level with  bedroom/bathroom Home Equipment: Rolling Walker (2 wheels);Cane - single point;BSC/3in1      Prior Function Prior Level of Function : Independent/Modified Independent             Mobility Comments: full comm amb w no AD ADLs Comments: (I) w ADLs/IADLs     Hand Dominance        Extremity/Trunk Assessment   Upper Extremity Assessment Upper Extremity Assessment: Overall WFL for tasks assessed    Lower Extremity Assessment Lower Extremity Assessment: LLE deficits/detail LLE Deficits / Details: impaired d/t surgical status LLE Sensation: WNL    Cervical / Trunk Assessment Cervical / Trunk Assessment: Normal  Communication   Communication: No difficulties  Cognition Arousal/Alertness: Awake/alert Behavior During Therapy: WFL for tasks assessed/performed Overall Cognitive Status: Within Functional Limits for tasks assessed                                          General Comments      Exercises Total Joint Exercises Ankle Circles/Pumps: AROM, Both, 10 reps Quad Sets: Strengthening, Both, 15 reps Gluteal Sets: Strengthening, Both, 10 reps Straight Leg Raises: Strengthening, Left, 5 reps Long Arc Quad: Strengthening, Left, 10 reps Goniometric ROM: L Knee AROM: -2-89 Marching in Standing: Strengthening, Both, 10 reps, Standing   Assessment/Plan    PT Assessment Patient needs continued PT services  PT Problem List Decreased strength;Decreased range of motion;Decreased activity tolerance;Decreased balance;Decreased mobility;Pain       PT Treatment Interventions DME instruction;Therapeutic exercise;Gait training;Balance training;Stair training;Neuromuscular re-education;Functional mobility training;Therapeutic activities;Patient/family education    PT Goals (Current goals can be found in the Care Plan section)  Acute Rehab PT Goals Patient Stated Goal: to walk up stairs PT Goal Formulation: With patient Time For Goal Achievement:  01/07/22 Potential to Achieve Goals: Good    Frequency BID     Co-evaluation               AM-PAC PT "6 Clicks" Mobility  Outcome Measure Help needed turning from your back to your side while in a flat bed without using bedrails?: None Help needed moving from lying on your back to sitting on the side of a flat bed without using bedrails?: None Help needed moving to and from a bed to a chair (including a wheelchair)?: A Little Help needed standing up from a chair using your arms (e.g., wheelchair or bedside chair)?: A Little Help needed to walk in hospital room?: A Little Help needed climbing 3-5 steps with a railing? : A Lot 6 Click Score: 19    End of Session Equipment Utilized During Treatment: Gait belt Activity Tolerance: Patient tolerated treatment well Patient left: in chair;with call bell/phone within reach;with chair alarm set;with family/visitor present;with SCD's reapplied, polar care reapplied   PT Visit Diagnosis: Other abnormalities of gait and mobility (R26.89);Muscle weakness (generalized) (M62.81);Pain Pain - Right/Left: Left Pain - part of body: Knee    Time: 1610-9604 PT Time Calculation (min) (ACUTE ONLY): 65 min   Charges:             Glenice Laine MPH, SPT 12/25/21, 5:07 PM

## 2021-12-25 NOTE — Interval H&P Note (Signed)
History and Physical Interval Note:  12/25/2021 6:07 AM  Nicole Jordan  has presented today for surgery, with the diagnosis of PRIMARY OSTEOARTHRITIS OF LEFT KNEE..  The various methods of treatment have been discussed with the patient and family. After consideration of risks, benefits and other options for treatment, the patient has consented to  Procedure(s): COMPUTER ASSISTED TOTAL KNEE ARTHROPLASTY - RNFA (Left) as a surgical intervention.  The patient's history has been reviewed, patient examined, no change in status, stable for surgery.  I have reviewed the patient's chart and labs.  Questions were answered to the patient's satisfaction.     D'Hanis

## 2021-12-25 NOTE — Anesthesia Procedure Notes (Signed)
Spinal  Patient location during procedure: OR Start time: 12/25/2021 7:20 AM End time: 12/25/2021 7:24 AM Reason for block: surgical anesthesia Staffing Performed: resident/CRNA  Resident/CRNA: Nelda Marseille, CRNA Performed by: Nelda Marseille, CRNA Authorized by: Molli Barrows, MD   Preanesthetic Checklist Completed: patient identified, IV checked, site marked, risks and benefits discussed, surgical consent, monitors and equipment checked, pre-op evaluation and timeout performed Spinal Block Patient position: sitting Prep: ChloraPrep Patient monitoring: heart rate, continuous pulse ox, blood pressure and cardiac monitor Approach: midline Location: L3-4 Injection technique: single-shot Needle Needle type: Whitacre and Introducer  Needle gauge: 25 G Needle length: 9 cm Assessment Sensory level: T10 Events: CSF return Additional Notes Sterile aseptic technique used throughout the procedure.  Negative paresthesia. Negative blood return. Positive free-flowing CSF. Expiration date of kit checked and confirmed. Patient tolerated procedure well, without complications.

## 2021-12-25 NOTE — Transfer of Care (Signed)
Immediate Anesthesia Transfer of Care Note  Patient: Nicole Jordan  Procedure(s) Performed: COMPUTER ASSISTED TOTAL KNEE ARTHROPLASTY (Left: Knee)  Patient Location: PACU  Anesthesia Type:Spinal  Level of Consciousness: awake, alert  and oriented  Airway & Oxygen Therapy: Patient Spontanous Breathing and Patient connected to face mask oxygen  Post-op Assessment: Report given to RN and Post -op Vital signs reviewed and stable  Post vital signs: Reviewed and stable  Last Vitals:  Vitals Value Taken Time  BP 105/70 12/25/21 1130  Temp    Pulse 71 12/25/21 1132  Resp 19 12/25/21 1132  SpO2 95 % 12/25/21 1132  Vitals shown include unvalidated device data.  Last Pain:  Vitals:   12/25/21 0627  TempSrc: Temporal  PainSc: 0-No pain      Patients Stated Pain Goal: 0 (19/59/74 7185)  Complications: No notable events documented.

## 2021-12-25 NOTE — Plan of Care (Signed)

## 2021-12-25 NOTE — Anesthesia Procedure Notes (Signed)
Date/Time: 12/25/2021 7:30 AM  Performed by: Nelda Marseille, CRNAPre-anesthesia Checklist: Patient identified, Emergency Drugs available, Suction available, Patient being monitored and Timeout performed Oxygen Delivery Method: Simple face mask

## 2021-12-25 NOTE — Anesthesia Preprocedure Evaluation (Signed)
Anesthesia Evaluation  Patient identified by MRN, date of birth, ID band Patient awake    Reviewed: Allergy & Precautions, H&P , NPO status , Patient's Chart, lab work & pertinent test results, reviewed documented beta blocker date and time   Airway Mallampati: II   Neck ROM: full    Dental  (+) Poor Dentition   Pulmonary sleep apnea and Continuous Positive Airway Pressure Ventilation ,    Pulmonary exam normal        Cardiovascular Exercise Tolerance: Poor hypertension, On Medications Normal cardiovascular exam+ dysrhythmias  Rhythm:regular Rate:Normal     Neuro/Psych Anxiety negative neurological ROS  negative psych ROS   GI/Hepatic Neg liver ROS, GERD  Medicated,  Endo/Other  negative endocrine ROSdiabetes, Well Controlled  Renal/GU negative Renal ROS  negative genitourinary   Musculoskeletal   Abdominal   Peds  Hematology negative hematology ROS (+)   Anesthesia Other Findings Past Medical History: No date: Abnormal uterine bleeding No date: Allergic rhinitis No date: Anxiety No date: Arthritis No date: Diabetes mellitus without complication (HCC) No date: Dysrhythmia No date: GERD (gastroesophageal reflux disease) No date: HLD (hyperlipidemia) No date: Hypertension No date: Internal hemorrhoids No date: Intraductal papilloma of breast No date: Sleep apnea     Comment:  cpap nightly No date: SVT (supraventricular tachycardia) (HCC) No date: Vertigo Past Surgical History: 1976: BREAST EXCISIONAL BIOPSY; Left     Comment:   Negative results 09/14/2013: BREAST EXCISIONAL BIOPSY; Left     Comment:  intraductal papilloma No date: BREAST SURGERY 2006: COLONOSCOPY     Comment:  2016 1975: DILATION AND CURETTAGE OF UTERUS 2023: EYE SURGERY; Bilateral     Comment:  cataract surgery 2014: [place]; Right 2004: UVULOPALATOPLASTY 2010: WRIST GANGLION EXCISION; Left BMI    Body Mass Index:  36.50 kg/m     Reproductive/Obstetrics negative OB ROS                             Anesthesia Physical Anesthesia Plan  ASA: 3  Anesthesia Plan: Spinal   Post-op Pain Management:    Induction:   PONV Risk Score and Plan: 4 or greater  Airway Management Planned:   Additional Equipment:   Intra-op Plan:   Post-operative Plan:   Informed Consent: I have reviewed the patients History and Physical, chart, labs and discussed the procedure including the risks, benefits and alternatives for the proposed anesthesia with the patient or authorized representative who has indicated his/her understanding and acceptance.     Dental Advisory Given  Plan Discussed with: CRNA  Anesthesia Plan Comments:         Anesthesia Quick Evaluation

## 2021-12-25 NOTE — Op Note (Signed)
OPERATIVE NOTE  DATE OF SURGERY:  12/25/2021  PATIENT NAME:  Nicole Jordan   DOB: Nov 29, 1945  MRN: [id number]  PRE-OPERATIVE DIAGNOSIS: Degenerative arthrosis of the left knee, primary  POST-OPERATIVE DIAGNOSIS:  Same  PROCEDURE:  Left total knee arthroplasty using computer-assisted navigation  SURGEON:  Marciano Sequin. M.D.  ANESTHESIA: spinal  ESTIMATED BLOOD LOSS: 50 mL  FLUIDS REPLACED: 1800 mL of crystalloid  TOURNIQUET TIME: 95 minutes  DRAINS: 2 medium Hemovac drains  SOFT TISSUE RELEASES: Anterior cruciate ligament, posterior cruciate ligament, deep medial collateral ligament, patellofemoral ligament  IMPLANTS UTILIZED: DePuy Attune size 7 posterior stabilized femoral component (cemented), size 8 rotating platform tibial component (cemented), 41 mm medialized dome patella (cemented), and a 12 mm stabilized rotating platform polyethylene insert.  INDICATIONS FOR SURGERY: Nicole SPAGNUOLO is a 76 y.o. year old female with a long history of progressive knee pain. X-rays demonstrated severe degenerative changes in tricompartmental fashion. The patient had not seen any significant improvement despite conservative nonsurgical intervention. After discussion of the risks and benefits of surgical intervention, the patient expressed understanding of the risks benefits and agree with plans for total knee arthroplasty.   The risks, benefits, and alternatives were discussed at length including but not limited to the risks of infection, bleeding, nerve injury, stiffness, blood clots, the need for revision surgery, cardiopulmonary complications, among others, and they were willing to proceed.  PROCEDURE IN DETAIL: The patient was brought into the operating room and, after adequate spinal anesthesia was achieved, a tourniquet was placed on the patient's upper thigh. The patient's knee and leg were cleaned and prepped with alcohol and DuraPrep and draped in the usual sterile fashion. A  "timeout" was performed as per usual protocol. The lower extremity was exsanguinated using an Esmarch, and the tourniquet was inflated to 300 mmHg. An anterior longitudinal incision was made followed by a standard mid vastus approach. The deep fibers of the medial collateral ligament were elevated in a subperiosteal fashion off of the medial flare of the tibia so as to maintain a continuous soft tissue sleeve. The patella was subluxed laterally and the patellofemoral ligament was incised. Inspection of the knee demonstrated severe degenerative changes with full-thickness loss of articular cartilage. Osteophytes were debrided using a rongeur. Anterior and posterior cruciate ligaments were excised. Two 4.0 mm Schanz pins were inserted in the femur and into the tibia for attachment of the array of trackers used for computer-assisted navigation. Hip center was identified using a circumduction technique. Distal landmarks were mapped using the computer. The distal femur and proximal tibia were mapped using the computer. The distal femoral cutting guide was positioned using computer-assisted navigation so as to achieve a 5 distal valgus cut. The femur was sized and it was felt that a size 7 femoral component was appropriate. A size 7 femoral cutting guide was positioned and the anterior cut was performed and verified using the computer. This was followed by completion of the posterior and chamfer cuts. Femoral cutting guide for the central box was then positioned in the center box cut was performed.  Attention was then directed to the proximal tibia. Medial and lateral menisci were excised. The extramedullary tibial cutting guide was positioned using computer-assisted navigation so as to achieve a 0 varus-valgus alignment and 3 posterior slope. The cut was performed and verified using the computer. The proximal tibia was sized and it was felt that a size 8 tibial tray was appropriate. Tibial and femoral trials were  inserted followed  by insertion of a 12 mm polyethylene insert. This allowed for excellent mediolateral soft tissue balancing both in flexion and in full extension. Finally, the patella was cut and prepared so as to accommodate a 41 mm medialized dome patella. A patella trial was placed and the knee was placed through a range of motion with excellent patellar tracking appreciated. The femoral trial was removed after debridement of posterior osteophytes. The central post-hole for the tibial component was reamed followed by insertion of a keel punch. Tibial trials were then removed. Cut surfaces of bone were irrigated with copious amounts of normal saline using pulsatile lavage and then suctioned dry. Polymethylmethacrylate cement with gentamicin was prepared in the usual fashion using a vacuum mixer. Cement was applied to the cut surface of the proximal tibia as well as along the undersurface of a size 8 rotating platform tibial component. Tibial component was positioned and impacted into place. Excess cement was removed using [employer] fast streamer. Cement was then applied to the cut surfaces of the femur as well as along the posterior flanges of the size 7 femoral component. The femoral component was positioned and impacted into place. Excess cement was removed using [employer] fast streamer. A 12 mm polyethylene trial was inserted and the knee was brought into full extension with steady axial compression applied. Finally, cement was applied to the backside of a 41 mm medialized dome patella and the patellar component was positioned and patellar clamp applied. Excess cement was removed using [employer] fast streamer. After adequate curing of the cement, the tourniquet was deflated after a total tourniquet time of 95 minutes. Hemostasis was achieved using electrocautery. The knee was irrigated with copious amounts of normal saline using pulsatile lavage followed by 450 ml of Surgiphor and then suctioned dry. 20 mL of 1.3% Exparel and 60 mL  of 0.25% Marcaine in 40 mL of normal saline was injected along the posterior capsule, medial and lateral gutters, and along the arthrotomy site. A 12 mm stabilized rotating platform polyethylene insert was inserted and the knee was placed through a range of motion with excellent mediolateral soft tissue balancing appreciated and excellent patellar tracking noted. 2 medium drains were placed in the wound bed and brought out through separate stab incisions. The medial parapatellar portion of the incision was reapproximated using interrupted sutures of #1 Vicryl. Subcutaneous tissue was approximated in layers using first #0 Vicryl followed #2-0 Vicryl. The skin was approximated with skin staples. A sterile dressing was applied.  The patient tolerated the procedure well and was transported to the recovery room in stable condition.    Bradi Arbuthnot P. Holley Bouche., M.D.

## 2021-12-26 ENCOUNTER — Encounter: Payer: Self-pay | Admitting: Orthopedic Surgery

## 2021-12-26 DIAGNOSIS — M1712 Unilateral primary osteoarthritis, left knee: Secondary | ICD-10-CM | POA: Diagnosis not present

## 2021-12-26 LAB — GLUCOSE, CAPILLARY: Glucose-Capillary: 124 mg/dL — ABNORMAL HIGH (ref 70–99)

## 2021-12-26 MED ORDER — TRAMADOL HCL 50 MG PO TABS: 50.0000 mg | ORAL_TABLET | ORAL | 0 refills | Status: AC | PRN

## 2021-12-26 MED ORDER — ENOXAPARIN SODIUM 40 MG/0.4ML IJ SOSY: 40.0000 mg | PREFILLED_SYRINGE | INTRAMUSCULAR | 0 refills | Status: AC

## 2021-12-26 MED ORDER — OXYCODONE HCL 5 MG PO TABS: 5.0000 mg | ORAL_TABLET | ORAL | 0 refills | Status: AC | PRN

## 2021-12-26 NOTE — Progress Notes (Signed)
Discharge instructions reviewed with pt. She verbalized understanding of instructions. Extra honeycomb dsg given to pt and printed prescriptions

## 2021-12-26 NOTE — Anesthesia Postprocedure Evaluation (Signed)
Anesthesia Post Note  Patient: Nicole Jordan  Procedure(s) Performed: COMPUTER ASSISTED TOTAL KNEE ARTHROPLASTY (Left: Knee)  Patient location during evaluation: Nursing Unit Anesthesia Type: Spinal Level of consciousness: oriented and awake and alert Pain management: pain level controlled Vital Signs Assessment: post-procedure vital signs reviewed and stable Respiratory status: spontaneous breathing and respiratory function stable Cardiovascular status: blood pressure returned to baseline and stable Postop Assessment: no headache, no backache, no apparent nausea or vomiting and patient able to bend at knees Anesthetic complications: no   No notable events documented.   Last Vitals:  Vitals:   12/26/21 0700 12/26/21 0918  BP: 92/65 107/63  Pulse:  65  Resp:  17  Temp:  36.7 C  SpO2:  100%    Last Pain:  Vitals:   12/26/21 0803  TempSrc:   PainSc: 0-No pain                 Reno Clasby B Clarisa Kindred

## 2021-12-26 NOTE — Plan of Care (Signed)
Problem: Education: Goal: Ability to describe self-care measures that may prevent or decrease complications (Diabetes Survival Skills Education) will improve Outcome: Progressing Goal: Individualized Educational Video(s) Outcome: Progressing   Problem: Coping: Goal: Ability to adjust to condition or change in health will improve Outcome: Progressing   Problem: Fluid Volume: Goal: Ability to maintain a balanced intake and output will improve Outcome: Progressing   Problem: Health Behavior/Discharge Planning: Goal: Ability to identify and utilize available resources and services will improve Outcome: Progressing Goal: Ability to manage health-related needs will improve Outcome: Progressing   Problem: Metabolic: Goal: Ability to maintain appropriate glucose levels will improve Outcome: Progressing   Problem: Nutritional: Goal: Maintenance of adequate nutrition will improve Outcome: Progressing Goal: Progress toward achieving an optimal weight will improve Outcome: Progressing   Problem: Skin Integrity: Goal: Risk for impaired skin integrity will decrease Outcome: Progressing   Problem: Tissue Perfusion: Goal: Adequacy of tissue perfusion will improve Outcome: Progressing   Problem: Education: Goal: Knowledge of the prescribed therapeutic regimen will improve Outcome: Progressing Goal: Individualized Educational Video(s) Outcome: Progressing   Problem: Activity: Goal: Ability to avoid complications of mobility impairment will improve Outcome: Progressing Goal: Range of joint motion will improve Outcome: Progressing   Problem: Clinical Measurements: Goal: Postoperative complications will be avoided or minimized Outcome: Progressing   Problem: Pain Management: Goal: Pain level will decrease with appropriate interventions Outcome: Progressing   Problem: Skin Integrity: Goal: Will show signs of wound healing Outcome: Progressing   Problem: Education: Goal:  Knowledge of General Education information will improve Description: Including pain rating scale, medication(s)/side effects and non-pharmacologic comfort measures Outcome: Progressing   Problem: Health Behavior/Discharge Planning: Goal: Ability to manage health-related needs will improve Outcome: Progressing   Problem: Clinical Measurements: Goal: Ability to maintain clinical measurements within normal limits will improve Outcome: Progressing Goal: Will remain free from infection Outcome: Progressing Goal: Diagnostic test results will improve Outcome: Progressing Goal: Respiratory complications will improve Outcome: Progressing Goal: Cardiovascular complication will be avoided Outcome: Progressing   Problem: Activity: Goal: Risk for activity intolerance will decrease Outcome: Progressing   Problem: Nutrition: Goal: Adequate nutrition will be maintained Outcome: Progressing   Problem: Coping: Goal: Level of anxiety will decrease Outcome: Progressing   Problem: Elimination: Goal: Will not experience complications related to bowel motility Outcome: Progressing Goal: Will not experience complications related to urinary retention Outcome: Progressing   Problem: Pain Managment: Goal: General experience of comfort will improve Outcome: Progressing   Problem: Safety: Goal: Ability to remain free from injury will improve Outcome: Progressing   Problem: Skin Integrity: Goal: Risk for impaired skin integrity will decrease Outcome: Progressing   Problem: Education: Goal: Knowledge of General Education information will improve Description: Including pain rating scale, medication(s)/side effects and non-pharmacologic comfort measures Outcome: Progressing   Problem: Health Behavior/Discharge Planning: Goal: Ability to manage health-related needs will improve Outcome: Progressing   Problem: Clinical Measurements: Goal: Ability to maintain clinical measurements within  normal limits will improve Outcome: Progressing Goal: Will remain free from infection Outcome: Progressing Goal: Diagnostic test results will improve Outcome: Progressing Goal: Respiratory complications will improve Outcome: Progressing Goal: Cardiovascular complication will be avoided Outcome: Progressing   Problem: Activity: Goal: Risk for activity intolerance will decrease Outcome: Progressing   Problem: Nutrition: Goal: Adequate nutrition will be maintained Outcome: Progressing   Problem: Coping: Goal: Level of anxiety will decrease Outcome: Progressing   Problem: Elimination: Goal: Will not experience complications related to bowel motility Outcome: Progressing Goal: Will not experience complications related to  urinary retention Outcome: Progressing

## 2021-12-26 NOTE — Progress Notes (Signed)
Patient lives at home with her spouse Has DME at home and does not need additional, Is set up with [facility] [facility] for Home health,  Spouse to provide transportation

## 2021-12-26 NOTE — Progress Notes (Signed)
  Subjective: 1 Day Post-Op Procedure(s) (LRB): COMPUTER ASSISTED TOTAL KNEE ARTHROPLASTY (Left) Patient reports pain as mild.   Patient is well, and has had no acute complaints or problems Plan is to go Home after hospital stay. Negative for chest pain and shortness of breath Fever: no Gastrointestinal: Negative for nausea and vomiting  Objective: Vital signs in last 24 hours: Temp:  [96.8 F (36 C)-98.2 F (36.8 C)] 98.2 F (36.8 C) (09/26 0600) Pulse Rate:  [53-74] 62 (09/26 0600) Resp:  [14-24] 18 (09/26 0600) BP: (92-126)/(65-88) 92/65 (09/26 0700) SpO2:  [94 %-100 %] 100 % (09/26 0600)  Intake/Output from previous day:  Intake/Output Summary (Last 24 hours) at 12/26/2021 0735 Last data filed at 12/26/2021 0446 Gross per 24 hour  Intake 2460 ml  Output 470 ml  Net 1990 ml    Intake/Output this shift: No intake/output data recorded.  Labs: No results for input(s): "HGB" in the last 72 hours. No results for input(s): "WBC", "RBC", "HCT", "PLT" in the last 72 hours. No results for input(s): "NA", "K", "CL", "CO2", "BUN", "CREATININE", "GLUCOSE", "CALCIUM" in the last 72 hours. No results for input(s): "LABPT", "INR" in the last 72 hours.   EXAM General - Patient is Alert and Oriented Extremity - Neurovascular intact Sensation intact distally Dorsiflexion/Plantar flexion intact Compartment soft Dressing/Incision - clean, dry, with a Hemovac removed.  The Hemovac tubing was removed and completely intact. Motor Function - intact, moving foot and toes well on exam.  Able to straight leg raise independently.  Past Medical History:  Diagnosis Date   Abnormal uterine bleeding    Allergic rhinitis    Anxiety    Arthritis    Diabetes mellitus without complication (HCC)    Dysrhythmia    GERD (gastroesophageal reflux disease)    HLD (hyperlipidemia)    Hypertension    Internal hemorrhoids    Intraductal papilloma of breast    Sleep apnea    cpap nightly   SVT  (supraventricular tachycardia) (HCC)    Vertigo     Assessment/Plan: 1 Day Post-Op Procedure(s) (LRB): COMPUTER ASSISTED TOTAL KNEE ARTHROPLASTY (Left) Principal Problem:   Total knee replacement status  Estimated body mass index is 36.5 kg/m as calculated from the following:   Height as of this encounter: '5\' 10"'$  (1.778 m).   Weight as of this encounter: 115.4 kg. Advance diet Up with therapy D/C IV fluids Discharge home with home health  DVT Prophylaxis - Lovenox, Foot Pumps, and TED hose Weight-Bearing as tolerated to left leg  Reche Dixon, PA-C Orthopaedic Surgery 12/26/2021, 7:35 AM

## 2021-12-26 NOTE — Discharge Summary (Signed)
Physician Discharge Summary  Subjective: 1 Day Post-Op Procedure(s) (LRB): COMPUTER ASSISTED TOTAL KNEE ARTHROPLASTY (Left) Patient reports pain as mild.   Patient seen in rounds with Dr. Marry Guan. Patient is well, and has had no acute complaints or problems Patient is ready to go home with home health physical therapy  Physician Discharge Summary  Patient ID: Nicole Jordan MRN: [id number] DOB/AGE: [birth year] 76 y.o.  Admit date: 12/25/2021 Discharge date: 12/26/2021  Admission Diagnoses:  Discharge Diagnoses:  Principal Problem:   Total knee replacement status   Discharged Condition: fair  Hospital Course: The patient is postop day 1 from a left total knee arthroplasty.  She is doing well since surgery.  Her pain is manageable.  She has ambulated with physical therapy about 10 feet.  She did have mild lightheadedness with standing, but is improving.  Her vitals have remained stable.  She is ready to go home with home health physical therapy  Treatments: surgery:   Left total knee arthroplasty using computer-assisted navigation   SURGEON:  Marciano Sequin. M.D.   ANESTHESIA: spinal   ESTIMATED BLOOD LOSS: 50 mL   FLUIDS REPLACED: 1800 mL of crystalloid   TOURNIQUET TIME: 95 minutes   DRAINS: 2 medium Hemovac drains   SOFT TISSUE RELEASES: Anterior cruciate ligament, posterior cruciate ligament, deep medial collateral ligament, patellofemoral ligament   IMPLANTS UTILIZED: DePuy Attune size 7 posterior stabilized femoral component (cemented), size 8 rotating platform tibial component (cemented), 41 mm medialized dome patella (cemented), and a 12 mm stabilized rotating platform polyethylene insert.  Discharge Exam: Blood pressure 92/65, pulse 62, temperature 98.2 F (36.8 C), temperature source Oral, resp. rate 18, height '5\' 10"'$  (1.778 m), weight 115.4 kg, SpO2 100 %.   Disposition: Discharge disposition: 01-Home or Self Care        Allergies as of 12/26/2021        Reactions   Flagyl [metronidazole] Itching   Severe itching and burning    Silicone Other (See Comments)   Blister-  Paper tape is OK   Tape    Blister-  Paper tape is OK        Medication List     TAKE these medications    aspirin EC 81 MG tablet Take 81 mg by mouth daily. Swallow whole.   CALTRATE 600+D PO Take by mouth daily.   cholecalciferol 25 MCG (1000 UNIT) tablet Commonly known as: VITAMIN D3 Take 5,000 Units by mouth daily.   clobetasol 0.05 % external solution Commonly known as: TEMOVATE Apply 1 Application topically 2 (two) times daily.   Difluprednate 0.05 % Emul Apply 1 drop to eye QID. Both eyes   enoxaparin 40 MG/0.4ML injection Commonly known as: LOVENOX Inject 0.4 mLs (40 mg total) into the skin daily for 14 days.   fluticasone 50 MCG/ACT nasal spray Commonly known as: FLONASE Place into both nostrils daily.   lisinopril 10 MG tablet Commonly known as: ZESTRIL Take 10 mg by mouth daily.   metoprolol succinate 50 MG 24 hr tablet Commonly known as: TOPROL-XL Take 50 mg by mouth daily. Take with or immediately following a meal. Take 1 and 1/2 tablet   minoxidil 2.5 MG tablet Commonly known as: LONITEN Take by mouth 2 (two) times daily. Takes 1/2 tablet   omeprazole 40 MG capsule Commonly known as: PRILOSEC Take 40 mg by mouth as needed.   oxyCODONE 5 MG immediate release tablet Commonly known as: Oxy IR/ROXICODONE Take 1 tablet (5 mg total) by mouth every  4 (four) hours as needed for moderate pain (pain score 4-6).   rosuvastatin 5 MG tablet Commonly known as: CRESTOR Take 5 mg by mouth every other day.   traMADol 50 MG tablet Commonly known as: ULTRAM Take 1-2 tablets (50-100 mg total) by mouth every 4 (four) hours as needed for moderate pain.   triamterene-hydrochlorothiazide 37.5-25 MG tablet Commonly known as: MAXZIDE-25 Take 1 tablet by mouth daily.               Durable Medical Equipment  (From admission,  onward)           Start     Ordered   12/25/21 1233  DME Walker rolling  Once       Question:  Patient needs a walker to treat with the following condition  Answer:  Total knee replacement status   12/25/21 1232   12/25/21 1233  DME Bedside commode  Once       Question:  Patient needs a bedside commode to treat with the following condition  Answer:  Total knee replacement status   12/25/21 1232            Follow-up Information     Fausto Skillern, PA-C Follow up on 01/09/2022.   Specialty: Orthopedic Surgery Why: at 1:15pm Contact information: [place] [address] [phone]         Dereck Leep, MD Follow up on 02/06/2022.   Specialty: Orthopedic Surgery Why: at 2:45pm Contact information: [address] [facility] [place] [address] [phone]                 Signed: Prescott Parma, Takuma Cifelli 12/26/2021, 7:38 AM   Objective: Vital signs in last 24 hours: Temp:  [96.8 F (36 C)-98.2 F (36.8 C)] 98.2 F (36.8 C) (09/26 0600) Pulse Rate:  [53-74] 62 (09/26 0600) Resp:  [14-24] 18 (09/26 0600) BP: (92-126)/(65-88) 92/65 (09/26 0700) SpO2:  [94 %-100 %] 100 % (09/26 0600)  Intake/Output from previous day:  Intake/Output Summary (Last 24 hours) at 12/26/2021 0738 Last data filed at 12/26/2021 0446 Gross per 24 hour  Intake 2360 ml  Output 470 ml  Net 1890 ml    Intake/Output this shift: No intake/output data recorded.  Labs: No results for input(s): "HGB" in the last 72 hours. No results for input(s): "WBC", "RBC", "HCT", "PLT" in the last 72 hours. No results for input(s): "NA", "K", "CL", "CO2", "BUN", "CREATININE", "GLUCOSE", "CALCIUM" in the last 72 hours. No results for input(s): "LABPT", "INR" in the last 72 hours.  EXAM: General - Patient is Alert and Oriented Extremity - Neurovascular intact Sensation intact distally Dorsiflexion/Plantar  flexion intact Compartment soft Incision - clean, dry, with a Hemovac removed.  The Hemovac tubing was intact on removal. Motor Function -plantarflexion and dorsiflexion are intact.  Able to straight leg raise independently.  Assessment/Plan: 1 Day Post-Op Procedure(s) (LRB): COMPUTER ASSISTED TOTAL KNEE ARTHROPLASTY (Left) Procedure(s) (LRB): COMPUTER ASSISTED TOTAL KNEE ARTHROPLASTY (Left) Past Medical History:  Diagnosis Date   Abnormal uterine bleeding    Allergic rhinitis    Anxiety    Arthritis    Diabetes mellitus without complication (HCC)    Dysrhythmia    GERD (gastroesophageal reflux disease)    HLD (hyperlipidemia)    Hypertension    Internal hemorrhoids    Intraductal papilloma of breast    Sleep apnea    cpap nightly   SVT (supraventricular tachycardia) ([place])  Vertigo    Principal Problem:   Total knee replacement status  Estimated body mass index is 36.5 kg/m as calculated from the following:   Height as of this encounter: '5\' 10"'$  (1.778 m).   Weight as of this encounter: 115.4 kg. Advance diet Up with therapy D/C IV fluids Discharge home with home health Diet - Regular diet Follow up - in 2 weeks Activity - WBAT Disposition - Home Condition Upon Discharge - Stable DVT Prophylaxis - Lovenox and TED hose  Reche Dixon, PA-C Orthopaedic Surgery 12/26/2021, 7:38 AM

## 2021-12-26 NOTE — Progress Notes (Signed)
Physical Therapy Treatment Patient Details Name: Nicole Jordan MRN: [id number] DOB: 1946/03/23 Today's Date: 12/26/2021   History of Present Illness Pt is 36 YOF admitted for L TKA. PMH includes: ganglion cyst, GERD, hx of dysfunctional uterine bleeding, HTN, hyperglycemia, HLD, SVT, sleep apnea.    PT Comments    Pt received upright in bed agreeable to PT services. Pt indep with bed mobility requiring supervision for transfers, gait, stair training. Pt ambulating ~ 200' with RW and consistent step through cadence. PT demo and education to pt and spouse on asc/desc stairs with pt performing safely with correct use of rails and sequencing of LE's. Pt returning to room performing HEP with good form/technique. Educated on car transfer. No concerns or questions from pt or spouse. Pt remains safe to d/c home with [facility] PT.     Recommendations for follow up therapy are one component of a multi-disciplinary discharge planning process, led by the attending physician.  Recommendations may be updated based on patient status, additional functional criteria and insurance authorization.  Follow Up Recommendations  Home health PT     Assistance Recommended at Discharge Intermittent Supervision/Assistance  Patient can return home with the following A little help with walking and/or transfers;A little help with bathing/dressing/bathroom;Assistance with cooking/housework;Assist for transportation;Help with stairs or ramp for entrance   Equipment Recommendations  None recommended by PT    Recommendations for Other Services       Precautions / Restrictions Precautions Precautions: Knee;Fall Precaution Booklet Issued: Yes (comment) Restrictions Weight Bearing Restrictions: Yes LLE Weight Bearing: Weight bearing as tolerated Other Position/Activity Restrictions: per MD, avoid bone foam at this time, use towel roll instead     Mobility  Bed Mobility Overal bed mobility: Independent                Patient Response: Cooperative  Transfers Overall transfer level: Needs assistance Equipment used: Rolling walker (2 wheels) Transfers: Sit to/from Stand Sit to Stand: Supervision           General transfer comment: VC's for hand placement    Ambulation/Gait Ambulation/Gait assistance: Supervision Gait Distance (Feet): 200 Feet Assistive device: Rolling walker (2 wheels) Gait Pattern/deviations: Step-through pattern, Decreased stride length       General Gait Details: consistent step through gait throughout with RW use.   Stairs Stairs: Yes Stairs assistance: Supervision Stair Management: One rail Left, Step to pattern, Sideways Number of Stairs: 8 General stair comments: 4 steps x2 reps asc/desc. PT demo prior to completion. Safe performance noted throughout.   Wheelchair Mobility    Modified Rankin (Stroke Patients Only)       Balance Overall balance assessment: Needs assistance Sitting-balance support: Single extremity supported, Feet unsupported Sitting balance-Leahy Scale: Good     Standing balance support: Bilateral upper extremity supported, During functional activity, Reliant on assistive device for balance Standing balance-Leahy Scale: Fair                              Cognition Arousal/Alertness: Awake/alert Behavior During Therapy: WFL for tasks assessed/performed Overall Cognitive Status: Within Functional Limits for tasks assessed                                          Exercises Total Joint Exercises Ankle Circles/Pumps: AROM, Both, 10 reps, Seated Quad Sets: AROM, Left, Strengthening, 10 reps, Supine  Short Arc Quad: AROM, Strengthening, Left, 10 reps, Supine Heel Slides: AAROM, Strengthening, Left, 10 reps, Supine Hip ABduction/ADduction: AROM, Strengthening, Left, 10 reps, Supine Straight Leg Raises: AROM, Strengthening, Left, 10 reps, Supine Long Arc Quad: Strengthening, Left, 10 reps, Seated     General Comments        Pertinent Vitals/Pain Pain Assessment Pain Assessment: No/denies pain    Home Living                          Prior Function            PT Goals (current goals can now be found in the care plan section) Acute Rehab PT Goals Patient Stated Goal: to walk up stairs PT Goal Formulation: With patient Time For Goal Achievement: 01/07/22 Potential to Achieve Goals: Good Progress towards PT goals: Progressing toward goals    Frequency    BID      PT Plan Current plan remains appropriate    Co-evaluation              AM-PAC PT "6 Clicks" Mobility   Outcome Measure  Help needed turning from your back to your side while in a flat bed without using bedrails?: None Help needed moving from lying on your back to sitting on the side of a flat bed without using bedrails?: None Help needed moving to and from a bed to a chair (including a wheelchair)?: None Help needed standing up from a chair using your arms (e.g., wheelchair or bedside chair)?: A Little Help needed to walk in hospital room?: A Little Help needed climbing 3-5 steps with a railing? : A Little 6 Click Score: 21    End of Session Equipment Utilized During Treatment: Gait belt Activity Tolerance: Patient tolerated treatment well Patient left: with call bell/phone within reach;with chair alarm set;with family/visitor present;with SCD's reapplied;in bed Nurse Communication: Mobility status PT Visit Diagnosis: Other abnormalities of gait and mobility (R26.89);Muscle weakness (generalized) (M62.81);Pain Pain - Right/Left: Left Pain - part of body: Knee     Time: 7078-6754 PT Time Calculation (min) (ACUTE ONLY): 43 min  Charges:  $Gait Training: 23-37 mins $Therapeutic Exercise: 8-22 mins                    Tekoa Hamor M. Fairly IV, PT, DPT Physical Therapist- [facility]  12/26/2021, 10:29 AM

## 2022-09-13 ENCOUNTER — Other Ambulatory Visit: Payer: Self-pay | Admitting: Nephrology

## 2022-09-13 DIAGNOSIS — N1831 Chronic kidney disease, stage 3a: Secondary | ICD-10-CM

## 2022-09-20 ENCOUNTER — Ambulatory Visit
Admission: RE | Admit: 2022-09-20 | Discharge: 2022-09-20 | Disposition: A | Discharge status: Home / Self Care | Payer: Medicare PPO | Source: Ambulatory Visit | Attending: Nephrology | Admitting: Nephrology

## 2022-09-20 DIAGNOSIS — N1831 Chronic kidney disease, stage 3a: Secondary | ICD-10-CM | POA: Diagnosis present

## 2022-09-20 DIAGNOSIS — E1122 Type 2 diabetes mellitus with diabetic chronic kidney disease: Secondary | ICD-10-CM

## 2022-10-02 ENCOUNTER — Other Ambulatory Visit: Payer: Self-pay | Admitting: Family Medicine

## 2022-10-02 DIAGNOSIS — Z1231 Encounter for screening mammogram for malignant neoplasm of breast: Secondary | ICD-10-CM

## 2022-10-25 ENCOUNTER — Ambulatory Visit
Admission: RE | Admit: 2022-10-25 | Discharge: 2022-10-25 | Disposition: A | Discharge status: Home / Self Care | Payer: Medicare PPO | Source: Ambulatory Visit | Attending: Family Medicine | Admitting: Family Medicine

## 2022-10-25 DIAGNOSIS — Z1231 Encounter for screening mammogram for malignant neoplasm of breast: Secondary | ICD-10-CM | POA: Insufficient documentation

## 2022-10-30 ENCOUNTER — Other Ambulatory Visit: Payer: Self-pay | Admitting: Family Medicine

## 2022-10-30 DIAGNOSIS — R928 Other abnormal and inconclusive findings on diagnostic imaging of breast: Secondary | ICD-10-CM

## 2022-10-31 ENCOUNTER — Ambulatory Visit
Admission: RE | Admit: 2022-10-31 | Discharge: 2022-10-31 | Disposition: A | Discharge status: Home / Self Care | Payer: Medicare PPO | Source: Ambulatory Visit | Attending: Family Medicine | Admitting: Family Medicine

## 2022-10-31 DIAGNOSIS — R928 Other abnormal and inconclusive findings on diagnostic imaging of breast: Secondary | ICD-10-CM | POA: Diagnosis present

## 2023-04-25 IMAGING — MG MM DIGITAL SCREENING BILAT W/ TOMO AND CAD
8 series · 8 of 24 positions shown · non-contrast
Comparison: Previous exam(s).

CLINICAL DATA: Screening.

EXAM:
DIGITAL SCREENING BILATERAL MAMMOGRAM WITH TOMOSYNTHESIS AND CAD
TECHNIQUE: Bilateral screening digital craniocaudal and mediolateral oblique
mammograms were obtained. Bilateral screening digital breast
tomosynthesis was performed. The images were evaluated with
computer-aided detection.

[L MLO synth-2D]
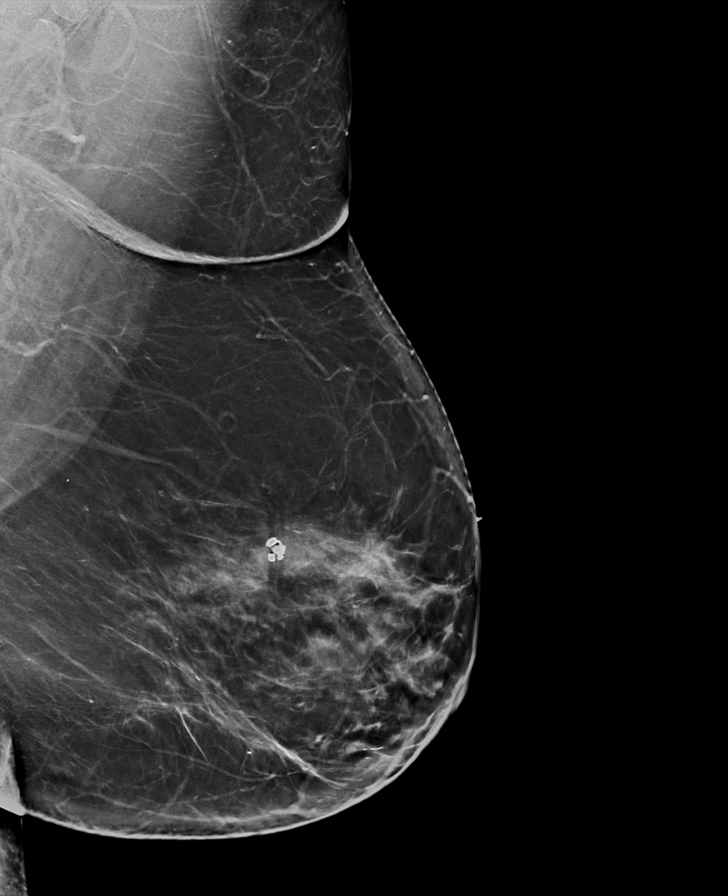

[R CC synth-2D]
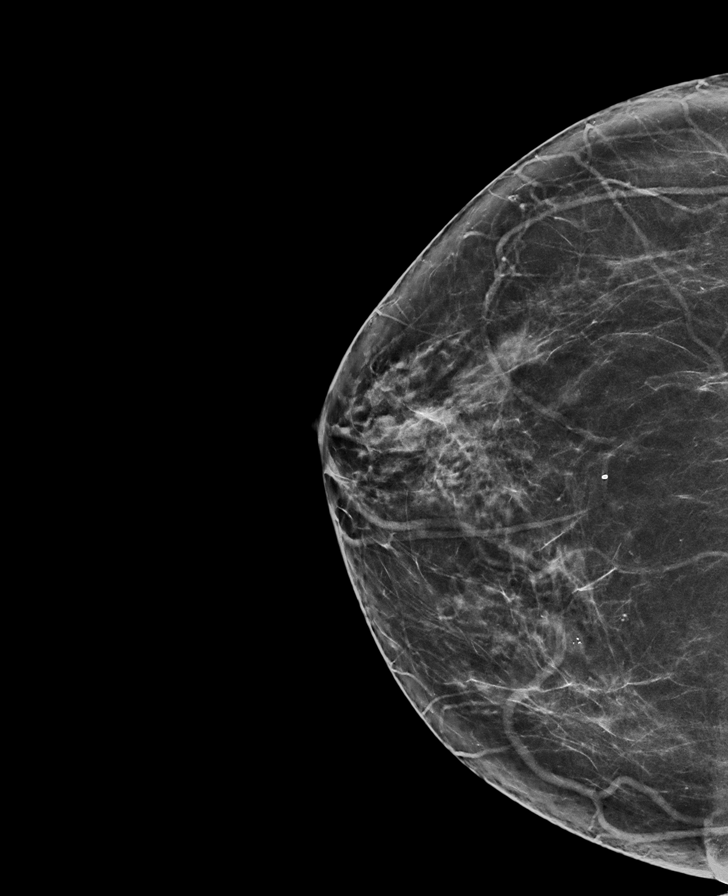

[L CC synth-2D]
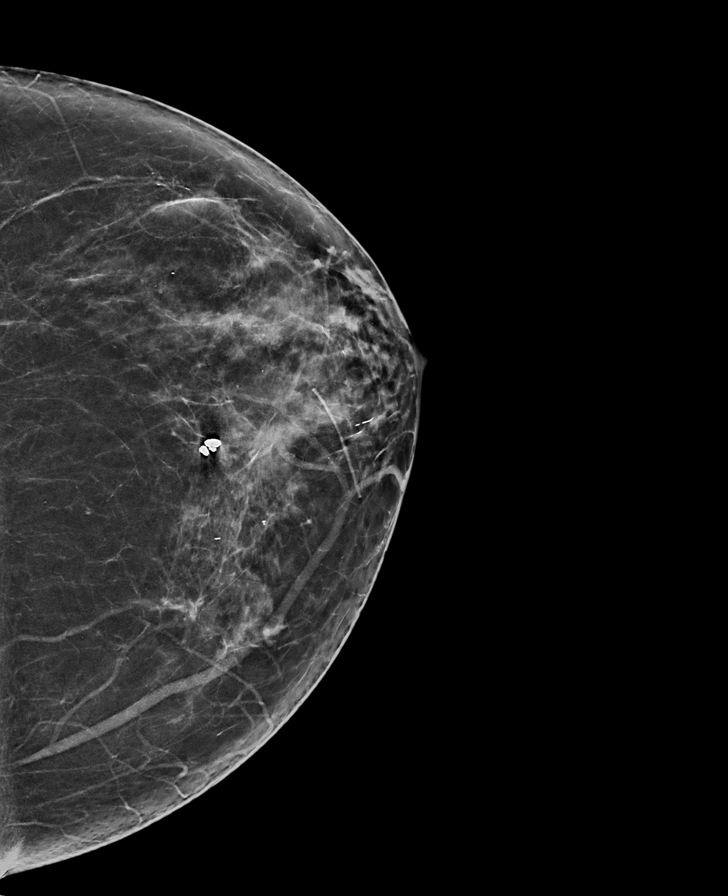

[R MLO synth-2D]
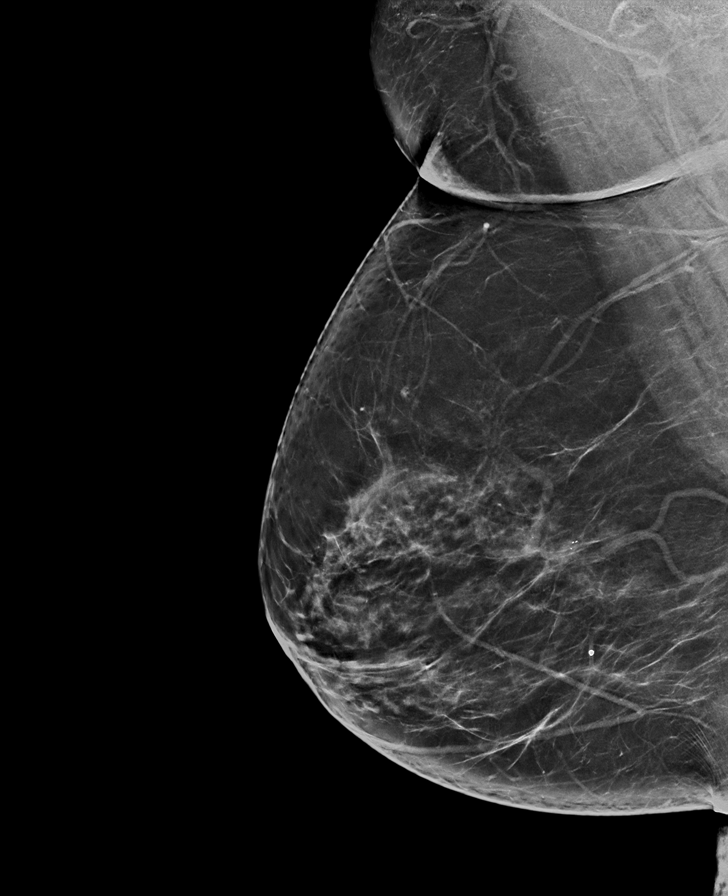

[R CC tomo · tomo slice 37/73.0]
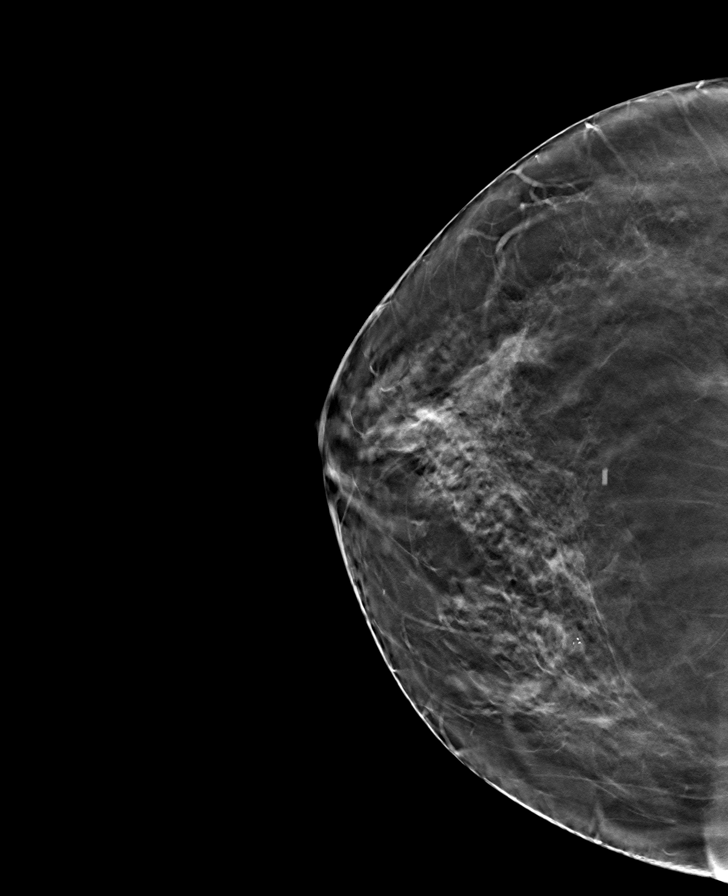

[R MLO tomo · tomo slice 43/84.0]
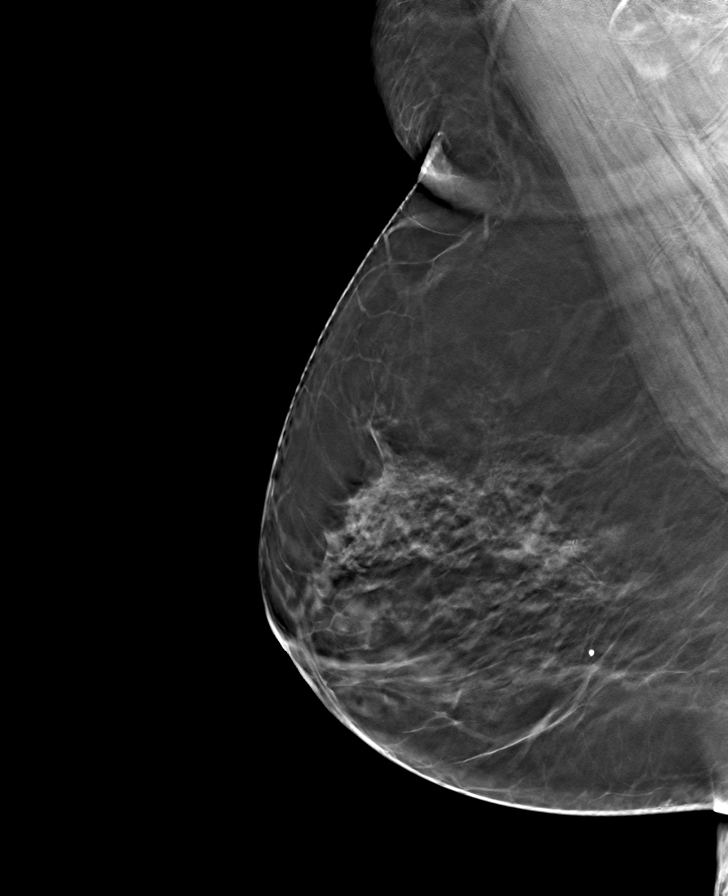

[L MLO tomo · tomo slice 46/91.0]
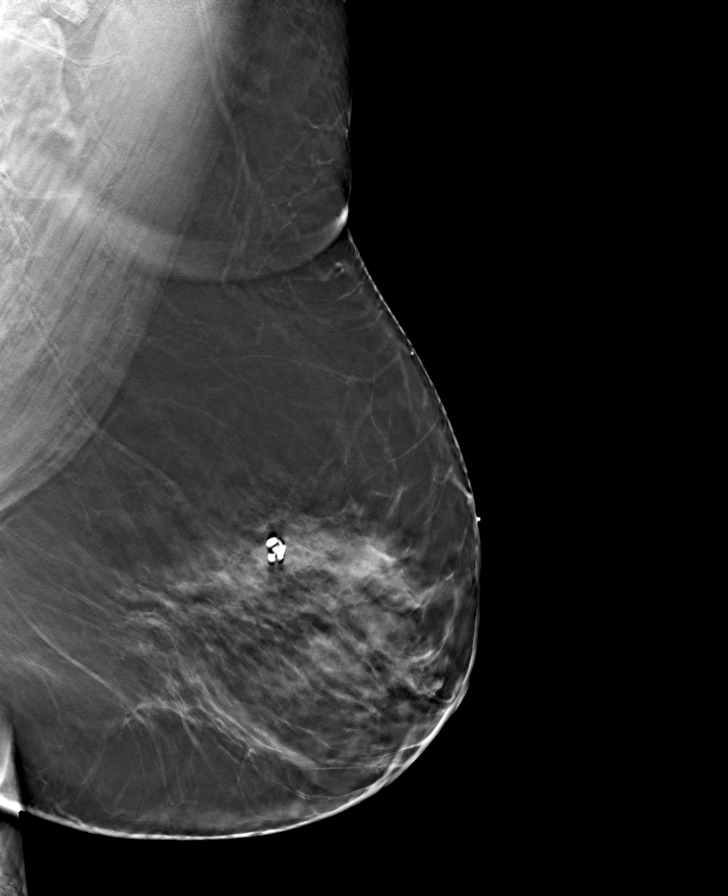

[L CC tomo · tomo slice 36/71.0]
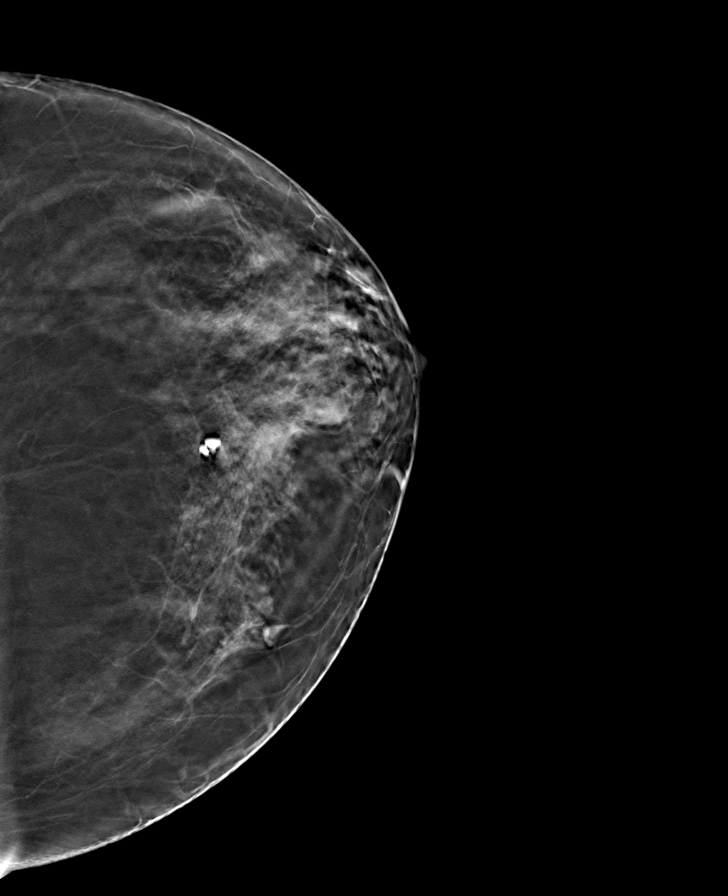

[8 of 24 positions shown; findings below may reference images not displayed]

ACR Breast Density Category c: The breast tissue is heterogeneously
dense, which may obscure small masses.
FINDINGS: There are no findings suspicious for malignancy.
IMPRESSION: No mammographic evidence of malignancy. A result letter of this
screening mammogram will be mailed directly to the patient.

RECOMMENDATION:
Screening mammogram in one year. (Code:Q3-W-BC3)

BI-RADS CATEGORY  1: Negative.

## 2023-09-18 ENCOUNTER — Other Ambulatory Visit: Payer: Self-pay

## 2023-09-18 ENCOUNTER — Emergency Department
Admission: EM | Admit: 2023-09-18 | Discharge: 2023-09-18 | Disposition: A | Discharge status: Home / Self Care | Attending: Emergency Medicine | Admitting: Emergency Medicine

## 2023-09-18 ENCOUNTER — Emergency Department

## 2023-09-18 DIAGNOSIS — R002 Palpitations: Secondary | ICD-10-CM | POA: Insufficient documentation

## 2023-09-18 LAB — BASIC METABOLIC PANEL WITH GFR
Anion gap: 8 (ref 5–15)
BUN: 19 mg/dL (ref 8–23)
CO2: 25 mmol/L (ref 22–32)
Calcium: 9.9 mg/dL (ref 8.9–10.3)
Chloride: 106 mmol/L (ref 98–111)
Creatinine, Ser: 1.12 mg/dL — ABNORMAL HIGH (ref 0.44–1.00)
GFR, Estimated: 51 mL/min — ABNORMAL LOW (ref >60)
Glucose, Bld: 130 mg/dL — ABNORMAL HIGH (ref 70–99)
Potassium: 3.8 mmol/L (ref 3.5–5.1)
Sodium: 139 mmol/L (ref 135–145)

## 2023-09-18 LAB — CBC
HCT: 42.5 % (ref 36.0–46.0)
Hemoglobin: 13.5 g/dL (ref 12.0–15.0)
MCH: 30.3 pg (ref 26.0–34.0)
MCHC: 31.8 g/dL (ref 30.0–36.0)
MCV: 95.5 fL (ref 80.0–100.0)
Platelets: 176 10*3/uL (ref 150–400)
RBC: 4.45 MIL/uL (ref 3.87–5.11)
RDW: 13.2 % (ref 11.5–15.5)
WBC: 4.4 10*3/uL (ref 4.0–10.5)
nRBC: 0 % (ref 0.0–0.2)

## 2023-09-18 LAB — TROPONIN I (HIGH SENSITIVITY)
Troponin I (High Sensitivity): 8 ng/L (ref <18)
Troponin I (High Sensitivity): 8 ng/L (ref <18)

## 2023-09-18 NOTE — ED Provider Notes (Signed)
 [facility] Provider Note    Event Date/Time   First MD Initiated Contact with Patient 09/18/23 1717     (approximate)   History   Palpitations   HPI  Nicole Jordan is a 78 y.o. female who presents to the emergency department today because of concerns for fast heart rate that occurred earlier today.  She stated that it occurred suddenly.  She felt like her heart was racing.  She can see it through her shirt.  She denies any associated pain.  Did go on and off for a couple of hours.  Has a history of SVT and this did remind her of the previous episode.  Patient does state that they decrease her metoprolol  dose about a month and a half ago due to low blood pressure.  She denies any caffeine or energy drink use earlier today.  At the time my exam she states she feels back to her normal state of health.      Physical Exam   Triage Vital Signs: ED Triage Vitals  Encounter Vitals Group     BP 09/18/23 1431 (!) 133/91     Girls Systolic BP Percentile --      Girls Diastolic BP Percentile --      Boys Systolic BP Percentile --      Boys Diastolic BP Percentile --      Pulse Rate 09/18/23 1431 66     Resp 09/18/23 1431 17     Temp 09/18/23 1431 98.1 F (36.7 C)     Temp Source 09/18/23 1431 Oral     SpO2 09/18/23 1431 97 %     Weight 09/18/23 1430 254 lb 6.6 oz (115.4 kg)     Height 09/18/23 1430 5' 10 (1.778 m)     Head Circumference --      Peak Flow --      Pain Score 09/18/23 1430 0     Pain Loc --      Pain Education --      Exclude from Growth Chart --     Most recent vital signs: Vitals:   09/18/23 1431  BP: (!) 133/91  Pulse: 66  Resp: 17  Temp: 98.1 F (36.7 C)  SpO2: 97%   General: Awake, alert, oriented. CV:  Good peripheral perfusion. Regular rate and rhythm. Resp:  Normal effort. Lungs clear. Abd:  No distention.    ED Results / Procedures / Treatments   Labs (all labs ordered are listed, but only abnormal results are  displayed) Labs Reviewed  BASIC METABOLIC PANEL WITH GFR - Abnormal; Notable for the following components:      Result Value   Glucose, Bld 130 (*)    Creatinine, Ser 1.12 (*)    GFR, Estimated 51 (*)    All other components within normal limits  CBC  TROPONIN I (HIGH SENSITIVITY)  TROPONIN I (HIGH SENSITIVITY)     EKG  I, Marylynn Soho, attending physician, personally viewed and interpreted this EKG  EKG Time: 1430 Rate: 65 Rhythm: sinus rhythm with sinus arrhythmia and 1st degree av block Axis: left axis deviation Intervals: qtc 366 QRS: narrow ST changes: no st elevation Impression: abnormal ekg   RADIOLOGY I independently interpreted and visualized the CXR. My interpretation: No pneumonia Radiology interpretation:  IMPRESSION:  No active cardiopulmonary disease.      PROCEDURES:  Critical Care performed: No   MEDICATIONS ORDERED IN ED: Medications - No data to display   IMPRESSION /  MDM / ASSESSMENT AND PLAN / ED COURSE  I reviewed the triage vital signs and the nursing notes.                              Differential diagnosis includes, but is not limited to, arrhythmia, ACS, electrolyte abnormality  Patient's presentation is most consistent with acute presentation with potential threat to life or bodily function.   The patient is on the cardiac monitor to evaluate for evidence of arrhythmia and/or significant heart rate changes.  Patient presented to the emergency department today because of concerns for palpitation's.  The time my exam patient feels back to her baseline.  EKG here shows sinus rhythm with some arrhythmia.  Blood work without any significant electrolyte abnormality.  Troponin negative x 2.  At this time do wonder if patient had SVT earlier in the day.  Discussed with patient importance of following up with primary care.      FINAL CLINICAL IMPRESSION(S) / ED DIAGNOSES   Final diagnoses:  Palpitations     Note:  This  document was prepared using Dragon voice recognition software and may include unintentional dictation errors.    Marylynn Soho, MD 09/18/23 [phone]

## 2023-09-18 NOTE — ED Notes (Signed)
 Pt states she had some BP medication adjustments about a month. Palpitations started today, last occurrence was years ago.

## 2023-09-18 NOTE — ED Triage Notes (Signed)
 Pt here from [facility] with palpitations. Pt states she just woke up this morning with them, denies exertion. Pt denies CP or SOB. Pt states she has a hx of arrhythmias. Pt endorses some nausea but denies VD.

## 2023-09-19 LAB — AMB RESULTS CONSOLE CBG: Glucose: 111

## 2023-09-20 NOTE — Progress Notes (Signed)
 Patient came to mobile screening at [facility]. Blood pressure levels elevated but drop to normal range. 143/88>118/90. Non fasting glucose 111. No SDOH

## 2023-10-08 ENCOUNTER — Other Ambulatory Visit: Payer: Self-pay | Admitting: Family Medicine

## 2023-10-08 ENCOUNTER — Encounter: Payer: Self-pay | Admitting: Family Medicine

## 2023-10-08 DIAGNOSIS — Z1231 Encounter for screening mammogram for malignant neoplasm of breast: Secondary | ICD-10-CM

## 2023-11-13 ENCOUNTER — Ambulatory Visit
Admission: RE | Admit: 2023-11-13 | Discharge: 2023-11-13 | Disposition: A | Discharge status: Home / Self Care | Source: Ambulatory Visit | Attending: Family Medicine | Admitting: Family Medicine

## 2023-11-13 DIAGNOSIS — Z1231 Encounter for screening mammogram for malignant neoplasm of breast: Secondary | ICD-10-CM | POA: Insufficient documentation
